# Patient Record
Sex: Female | Born: 1985 | Race: White | Hispanic: No | Marital: Single | Smoking: Former smoker
Health system: Southern US, Community
[De-identification: ages and names within clinical notes are randomized; demographics above are authoritative.]

## PROBLEM LIST (undated history)

## (undated) DIAGNOSIS — Z789 Other specified health status: Secondary | ICD-10-CM

## (undated) HISTORY — PX: WISDOM TOOTH EXTRACTION: SHX21

## (undated) HISTORY — PX: ANKLE FRACTURE SURGERY: SHX122

---

## 2011-07-21 ENCOUNTER — Emergency Department (HOSPITAL_BASED_OUTPATIENT_CLINIC_OR_DEPARTMENT_OTHER): Payer: 59

## 2011-07-21 ENCOUNTER — Emergency Department (HOSPITAL_BASED_OUTPATIENT_CLINIC_OR_DEPARTMENT_OTHER)
Admission: EM | Admit: 2011-07-21 | Discharge: 2011-07-21 | Disposition: A | Discharge status: Home / Self Care | Payer: 59 | Attending: Emergency Medicine | Admitting: Emergency Medicine

## 2011-07-21 ENCOUNTER — Emergency Department (INDEPENDENT_AMBULATORY_CARE_PROVIDER_SITE_OTHER): Payer: 59

## 2011-07-21 ENCOUNTER — Encounter (HOSPITAL_BASED_OUTPATIENT_CLINIC_OR_DEPARTMENT_OTHER): Payer: Self-pay | Admitting: *Deleted

## 2011-07-21 DIAGNOSIS — W108XXA Fall (on) (from) other stairs and steps, initial encounter: Secondary | ICD-10-CM | POA: Insufficient documentation

## 2011-07-21 DIAGNOSIS — W19XXXA Unspecified fall, initial encounter: Secondary | ICD-10-CM

## 2011-07-21 DIAGNOSIS — S93409A Sprain of unspecified ligament of unspecified ankle, initial encounter: Secondary | ICD-10-CM | POA: Insufficient documentation

## 2011-07-21 DIAGNOSIS — M7989 Other specified soft tissue disorders: Secondary | ICD-10-CM

## 2011-07-21 LAB — PREGNANCY, URINE: Preg Test, Ur: NEGATIVE

## 2011-07-21 MED ORDER — OXYCODONE-ACETAMINOPHEN 5-325 MG PO TABS: 1.0000 | ORAL_TABLET | Freq: Four times a day (QID) | ORAL | Status: AC | PRN

## 2011-07-21 MED ORDER — IBUPROFEN 600 MG PO TABS: 600.0000 mg | ORAL_TABLET | Freq: Four times a day (QID) | ORAL | Status: DC | PRN

## 2011-07-21 MED ORDER — OXYCODONE-ACETAMINOPHEN 5-325 MG PO TABS
1.0000 | ORAL_TABLET | Freq: Once | ORAL | Status: AC
  Administered 2011-07-21: 1 via ORAL
  Filled 2011-07-21: qty 1

## 2011-07-21 NOTE — Discharge Instructions (Signed)
Return to the ED with any concerns including worsening pain, numbness discoloration or swelling of her toes, or any other alarming symptoms.

## 2011-07-21 NOTE — ED Notes (Signed)
Pt reports she fell down 5 stairs in the dark- swelling noted to right ankle- 2+pedal pulse present

## 2011-07-21 NOTE — ED Notes (Signed)
Patient transported to X-ray 

## 2011-07-21 NOTE — ED Provider Notes (Signed)
History     CSN: [id number]  Arrival date & time 07/21/11  0204   First MD Initiated Contact with Patient 07/21/11 0225      Chief Complaint  Patient presents with  . Ankle Injury    (Consider location/radiation/quality/duration/timing/severity/associated sxs/prior treatment) HPI Patient presents with complaint of right ankle pain. She states that she fell down several stairs twisting her ankle tonight. She has not been able to bear weight due to pain. Pain is worse on the outer aspect of her ankle. She does have a prior surgical fixation of the ankle which she states was done several years ago. She denies any knee pain she has no neck or back pain and denies striking her head. Pain is continuous. There no systemic symptoms. Movement and palpation make pain worse. There no other alleviating or modifying factors the patient has not taken any medication for the pain prior to arrival. History reviewed. No pertinent past medical history.  Past Surgical History  Procedure Date  . Ankle fracture surgery   . Cesarean section     No family history on file.  History  Substance Use Topics  . Smoking status: Former Games developer  . Smokeless tobacco: Never Used  . Alcohol Use: Not on file    OB History    Grav Para Term Preterm Abortions TAB SAB Ect Mult Living                  Review of Systems ROS reviewed and otherwise negative except for mentioned in HPI  Allergies  Review of patient's allergies indicates no known allergies.  Home Medications   Current Outpatient Rx  Name Route Sig Dispense Refill  . IBUPROFEN 600 MG PO TABS Oral Take 1 tablet (600 mg total) by mouth every 6 (six) hours as needed for pain. 30 tablet 0  . OXYCODONE-ACETAMINOPHEN 5-325 MG PO TABS Oral Take 1-2 tablets by mouth every 6 (six) hours as needed for pain. 15 tablet 0    BP 135/96  Pulse 103  Temp(Src) 98.1 F (36.7 C) (Oral)  Resp 24  SpO2 98%  LMP 06/01/2011  Physical Exam Physical  Examination: General appearance - alert, well appearing, and in no distress Mental status - alert, oriented to person, place, and time Chest - clear to auscultation, no wheezes, rales or rhonchi, symmetric air entry Heart - normal rate, regular rhythm, normal S1, S2, no murmurs, rubs, clicks or gallops Neurological - alert, oriented, normal speech, foot distally NVI Musculoskeletal - right ankle with ttp over lateral malleolus with some overlying swelling, no ttp over fibular head, no medial malleolus tenderness or ttp over metatarsals Back- no midline tenderness to palpation Extremities - peripheral pulses normal, no pedal edema, no clubbing or cyanosis Skin - normal coloration and turgor, no rashes, brisk cap refill distally of foot  ED Course  Procedures (including critical care time)   Labs Reviewed  PREGNANCY, URINE   No results found.   1. Ankle sprain       MDM  Patient presents with complaint of pain in her right ankle after falling tonight. She has not been able to bear weight on her ankle do to significant pain. Her foot is distally neurovascularly intact. X-ray revealed signs of prior distal fibula fixation but no acute bony abnormality tonight. And ASO was applied. Patient was given pain control in the ED and discharged with pain medications. She was given strict return precautions and advised to followup with orthopedics if symptoms continue. Patient was  agreeable with the plan for discharge.        Ethelda Chick, MD 07/24/11 [phone]

## 2011-08-18 ENCOUNTER — Encounter (HOSPITAL_BASED_OUTPATIENT_CLINIC_OR_DEPARTMENT_OTHER): Payer: Self-pay | Admitting: *Deleted

## 2011-08-18 ENCOUNTER — Emergency Department (INDEPENDENT_AMBULATORY_CARE_PROVIDER_SITE_OTHER): Payer: 59

## 2011-08-18 ENCOUNTER — Emergency Department (HOSPITAL_BASED_OUTPATIENT_CLINIC_OR_DEPARTMENT_OTHER)
Admission: EM | Admit: 2011-08-18 | Discharge: 2011-08-18 | Disposition: A | Discharge status: Home / Self Care | Payer: 59 | Attending: Emergency Medicine | Admitting: Emergency Medicine

## 2011-08-18 DIAGNOSIS — J029 Acute pharyngitis, unspecified: Secondary | ICD-10-CM

## 2011-08-18 DIAGNOSIS — Z331 Pregnant state, incidental: Secondary | ICD-10-CM

## 2011-08-18 DIAGNOSIS — R112 Nausea with vomiting, unspecified: Secondary | ICD-10-CM | POA: Insufficient documentation

## 2011-08-18 DIAGNOSIS — R1110 Vomiting, unspecified: Secondary | ICD-10-CM

## 2011-08-18 DIAGNOSIS — R1032 Left lower quadrant pain: Secondary | ICD-10-CM

## 2011-08-18 DIAGNOSIS — Z87891 Personal history of nicotine dependence: Secondary | ICD-10-CM | POA: Insufficient documentation

## 2011-08-18 LAB — URINALYSIS, ROUTINE W REFLEX MICROSCOPIC
Bilirubin Urine: NEGATIVE
Glucose, UA: NEGATIVE mg/dL
Ketones, ur: NEGATIVE mg/dL
Leukocytes, UA: NEGATIVE
Nitrite: NEGATIVE
Protein, ur: NEGATIVE mg/dL

## 2011-08-18 MED ORDER — FOLIC ACID 800 MCG PO TABS: 400.0000 ug | ORAL_TABLET | Freq: Every day | ORAL | Status: AC

## 2011-08-18 NOTE — ED Notes (Signed)
Pt was brought to room 6, discussed with patient her symptoms and informed her of positive urine pregnancy test result.

## 2011-08-18 NOTE — ED Provider Notes (Signed)
History     CSN: [id number]  Arrival date & time 08/18/11  1217   First MD Initiated Contact with Patient 08/18/11 1259      Chief Complaint  Patient presents with  . Abdominal Pain  . Emesis  . Sore Throat    (Consider location/radiation/quality/duration/timing/severity/associated sxs/prior treatment) HPI  Pt presents to the ED with her boyfriend aftter having abdominal pain with nausea and vomiting for 3 days. She admits to having had a sore throat a couple of days ago which has resolved. She states that she has not been keeping anything down and has not tried anything for the symptoms. The pain she has been having are sharp LLQ pains that last only a couple of seconds and then resolve.She admits to having some chills. She denies weakness, fatigue, dizziness, diarrhea, fevers, SOB, CP.  History reviewed. No pertinent past medical history.  Past Surgical History  Procedure Date  . Ankle fracture surgery   . Cesarean section     History reviewed. No pertinent family history.  History  Substance Use Topics  . Smoking status: Former Games developer  . Smokeless tobacco: Never Used  . Alcohol Use: 2.4 oz/week    4 Cans of beer per week    OB History    Grav Para Term Preterm Abortions TAB SAB Ect Mult Living            1      Review of Systems  All other systems reviewed and are negative.    Allergies  Review of patient's allergies indicates no known allergies.  Home Medications   Current Outpatient Rx  Name Route Sig Dispense Refill  . ACETAMINOPHEN 500 MG PO TABS Oral Take 1,000 mg by mouth every 6 (six) hours as needed. Patient used this medication for her stomach pain.    . IBUPROFEN 200 MG PO TABS Oral Take 400 mg by mouth every 6 (six) hours as needed. Patient used this medication for her headache.    . OXYCODONE-ACETAMINOPHEN 5-325 MG PO TABS Oral Take 1 tablet by mouth every 4 (four) hours as needed.    Marland Kitchen FOLIC ACID 800 MCG PO TABS Oral Take 0.5 tablets (400 mcg  total) by mouth daily. 30 tablet 2    BP 123/65  Pulse 94  Temp(Src) 97.5 F (36.4 C) (Oral)  Resp 16  Ht 5\' 7"  (1.702 m)  Wt 210 lb (95.255 kg)  BMI 32.89 kg/m2  SpO2 98%  LMP 06/01/2011  Physical Exam  Nursing note and vitals reviewed. Constitutional: She appears well-developed and well-nourished. No distress.  HENT:  Head: Normocephalic and atraumatic.  Eyes: Pupils are equal, round, and reactive to light.  Neck: Normal range of motion. Neck supple.  Cardiovascular: Normal rate and regular rhythm.   Pulmonary/Chest: Effort normal.  Abdominal: Soft. Bowel sounds are normal. She exhibits no distension and no mass. There is no tenderness. There is no rebound and no guarding.       Benign abdominal exam. Uterus is non enlarged.  Neurological: She is alert.  Skin: Skin is warm and dry.    ED Course  Procedures (including critical care time)  Labs Reviewed  PREGNANCY, URINE - Abnormal; Notable for the following:    Preg Test, Ur POSITIVE (*)    All other components within normal limits  URINALYSIS, ROUTINE W REFLEX MICROSCOPIC   US Ob Comp Less 14 Wks  08/18/2011  *RADIOLOGY REPORT*  Clinical Data: Pregnant, left lower quadrant pain, uncertain LMP; no quantitative beta  HCG for correlation  OBSTETRIC <14 WK [place] AND TRANSVAGINAL OB US  Technique:  Both transabdominal and transvaginal ultrasound examinations were performed for complete evaluation of the gestation as well as the maternal uterus, adnexal regions, and pelvic cul-de-sac.  Transvaginal technique was performed to assess early pregnancy.  Comparison:  None  Intrauterine gestational sac:  Visualized/normal in shape. Yolk sac: Present Embryo: Not identified Cardiac Activity: N/A Heart Rate: N/A bpm  MSD: 10.5 mm,   5  w   6  d         [place] EDC: 04/13/2012  Maternal uterus/adnexae: No subchorionic hemorrhage. Right ovary normal size and morphology, 2.2 x 2.2 x 4.0 cm. Left ovary normal size and morphology, 2.7 x 4.3 x 2.4 cm. No  free pelvic fluid or adnexal masses.  IMPRESSION: Early intrauterine gestation measured at 5 weeks 6 days EGA by mean sac diameter. A yolk sac is identified to confirm intrauterine pregnancy though no fetal pole is yet identified to assess viability. Consider follow-up ultrasound in 10-14 days to assess fetal viability, if clinically indicated.  Original Report Authenticated By: Lollie Marrow, M.D.   US Ob Transvaginal  08/18/2011  *RADIOLOGY REPORT*  Clinical Data: Pregnant, left lower quadrant pain, uncertain LMP; no quantitative beta HCG for correlation  OBSTETRIC <14 WK [place] AND TRANSVAGINAL OB US  Technique:  Both transabdominal and transvaginal ultrasound examinations were performed for complete evaluation of the gestation as well as the maternal uterus, adnexal regions, and pelvic cul-de-sac.  Transvaginal technique was performed to assess early pregnancy.  Comparison:  None  Intrauterine gestational sac:  Visualized/normal in shape. Yolk sac: Present Embryo: Not identified Cardiac Activity: N/A Heart Rate: N/A bpm  MSD: 10.5 mm,   5  w   6  d         [place] EDC: 04/13/2012  Maternal uterus/adnexae: No subchorionic hemorrhage. Right ovary normal size and morphology, 2.2 x 2.2 x 4.0 cm. Left ovary normal size and morphology, 2.7 x 4.3 x 2.4 cm. No free pelvic fluid or adnexal masses.  IMPRESSION: Early intrauterine gestation measured at 5 weeks 6 days EGA by mean sac diameter. A yolk sac is identified to confirm intrauterine pregnancy though no fetal pole is yet identified to assess viability. Consider follow-up ultrasound in 10-14 days to assess fetal viability, if clinically indicated.  Original Report Authenticated By: Lollie Marrow, M.D.     1. Pregnancy as incidental finding       MDM    pts ultrasound shows single  intrauterine pregnancy. Urinalysis is negative for infection. Pt started on prenatal vitamins and referred [facility] for follow-up appointment and OB care.  Pt has been  advised of the symptoms that warrant their return to the ED. Patient has voiced understanding and has agreed to follow-up with the PCP or specialist.   Dr. Radford Pax was involved in the care of this patient.  Dorthula Matas, PA 08/30/11 1446

## 2011-08-18 NOTE — ED Notes (Signed)
Intermittent sharp abd pain x 3 days.  Throwing up since Friday.  Sore throat started yesterday. Throwing up everything the pt is eating and drinking.  Has not taken any medications for relief. Denies fever. Has had some chills.

## 2011-08-18 NOTE — Discharge Instructions (Signed)
ABCs of Pregnancy A Antepartum care is very important. Be sure you see your doctor and get prenatal care as soon as you think you are pregnant. At this time, you will be tested for infection, genetic abnormalities and potential problems with you and the pregnancy. This is the time to discuss diet, exercise, work, medications, labor, pain medication during labor and the possibility of a cesarean delivery. Ask any questions that may concern you. It is important to see your doctor regularly throughout your pregnancy. Avoid exposure to toxic substances and chemicals - such as cleaning solvents, lead and mercury, some insecticides, and paint. Pregnant women should avoid exposure to paint fumes, and fumes that cause you to feel ill, dizzy or faint. When possible, it is a good idea to have a pre-pregnancy consultation with your caregiver to begin some important recommendations your caregiver suggests such as, taking folic acid, exercising, quitting smoking, avoiding alcoholic beverages, etc. B Breastfeeding is the healthiest choice for both you and your baby. It has many nutritional benefits for the baby and health benefits for the mother. It also creates a very tight and loving bond between the baby and mother. Talk to your doctor, your family and friends, and your employer about how you choose to feed your baby and how they can support you in your decision. Not all birth defects can be prevented, but a woman can take actions that may increase her chance of having a healthy baby. Many birth defects happen very early in pregnancy, sometimes before a woman even knows she is pregnant. Birth defects or abnormalities of any child in your or the father's family should be discussed with your caregiver. Get a good support bra as your breast size changes. Wear it especially when you exercise and when nursing.  C Celebrate the news of your pregnancy with the your spouse/father and family. Childbirth classes are helpful to  take for you and the spouse/father because it helps to understand what happens during the pregnancy, labor and delivery. Cesarean delivery should be discussed with your doctor so you are prepared for that possibility. The pros and cons of circumcision if it is a boy, should be discussed with your pediatrician. Cigarette smoking during pregnancy can result in low birth weight babies. It has been associated with infertility, miscarriages, tubal pregnancies, infant death (mortality) and poor health (morbidity) in childhood. Additionally, cigarette smoking may cause long-term learning disabilities. If you smoke, you should try to quit before getting pregnant and not smoke during the pregnancy. Secondary smoke may also harm a mother and her developing baby. It is a good idea to ask people to stop smoking around you during your pregnancy and after the baby is born. Extra calcium is necessary when you are pregnant and is found in your prenatal vitamin, in dairy products, green leafy vegetables and in calcium supplements. D A healthy diet according to your current weight and height, along with vitamins and mineral supplements should be discussed with your caregiver. Domestic abuse or violence should be made known to your doctor right away to get the situation corrected. Drink more water when you exercise to keep hydrated. Discomfort of your back and legs usually develops and progresses from the middle of the second trimester through to delivery of the baby. This is because of the enlarging baby and uterus, which may also affect your balance. Do not take illegal drugs. Illegal drugs can seriously harm the baby and you. Drink extra fluids (water is best) throughout pregnancy to help  your body keep up with the increases in your blood volume. Drink at least 6 to 8 glasses of water, fruit juice, or milk each day. A good way to know you are drinking enough fluid is when your urine looks almost like clear water or is very light  yellow.  E Eat healthy to get the nutrients you and your unborn baby need. Your meals should include the five basic food groups. Exercise (30 minutes of light to moderate exercise a day) is important and encouraged during pregnancy, if there are no medical problems or problems with the pregnancy. Exercise that causes discomfort or dizziness should be stopped and reported to your caregiver. Emotions during pregnancy can change from being ecstatic to depression and should be understood by you, your partner and your family. F Fetal screening with ultrasound, amniocentesis and monitoring during pregnancy and labor is common and sometimes necessary. Take 400 micrograms of folic acid daily both before, when possible, and during the first few months of pregnancy to reduce the risk of birth defects of the brain and spine. All women who could possibly become pregnant should take a vitamin with folic acid, every day. It is also important to eat a healthy diet with fortified foods (enriched grain products, including cereals, rice, breads, and pastas) and foods with natural sources of folate (orange juice, green leafy vegetables, beans, peanuts, broccoli, asparagus, peas, and lentils). The father should be involved with all aspects of the pregnancy including, the prenatal care, childbirth classes, labor, delivery, and postpartum time. Fathers may also have emotional concerns about being a father, financial needs, and raising a family. G Genetic testing should be done appropriately. It is important to know your family and the father's history. If there have been problems with pregnancies or birth defects in your family, report these to your doctor. Also, genetic counselors can talk with you about the information you might need in making decisions about having a family. You can call a major medical center in your area for help in finding a board-certified genetic counselor. Genetic testing and counseling should be done  before pregnancy when possible, especially if there is a history of problems in the mother's or father's family. Certain ethnic backgrounds are more at risk for genetic defects. H Get familiar with the hospital where you will be having your baby. Get to know how long it takes to get there, the labor and delivery area, and the hospital procedures. Be sure your medical insurance is accepted there. Get your home ready for the baby including, clothes, the baby's room (when possible), furniture and car seat. Hand washing is important throughout the day, especially after handling raw meat and poultry, changing the baby's diaper or using the bathroom. This can help prevent the spread of many bacteria and viruses that cause infection. Your hair may become dry and thinner, but will return to normal a few weeks after the baby is born. Heartburn is a common problem that can be treated by taking antacids recommended by your caregiver, eating smaller meals 5 or 6 times a day, not drinking liquids when eating, drinking between meals and raising the head of your bed 2 to 3 inches. I Insurance to cover you, the baby, doctor and hospital should be reviewed so that you will be prepared to pay any costs not covered by your insurance plan. If you do not have medical insurance, there are usually clinics and services available for you in your community. Take 30 milligrams of iron during  your pregnancy as prescribed by your doctor to reduce the risk of low red blood cells (anemia) later in pregnancy. All women of childbearing age should eat a diet rich in iron. J There should be a joint effort for the mother, father and any other children to adapt to the pregnancy financially, emotionally, and psychologically during the pregnancy. Join a support group for moms-to-be. Or, join a class on parenting or childbirth. Have the family participate when possible. K Know your limits. Let your caregiver know if you experience any of the  following:   Pain of any kind.   Strong cramps.   You develop a lot of weight in a short period of time (5 pounds in 3 to 5 days).   Vaginal bleeding, leaking of amniotic fluid.   Headache, vision problems.   Dizziness, fainting, shortness of breath.   Chest pain.   Fever of 102 F (38.9 C) or higher.   Gush of clear fluid from your vagina.   Painful urination.   Domestic violence.   Irregular heartbeat (palpitations).   Rapid beating of the heart (tachycardia).   Constant feeling sick to your stomach (nauseous) and vomiting.   Trouble walking, fluid retention (edema).   Muscle weakness.   If your baby has decreased activity.   Persistent diarrhea.   Abnormal vaginal discharge.   Uterine contractions at 20-minute intervals.   Back pain that travels down your leg.  L Learn and practice that what you eat and drink should be in moderation and healthy for you and your baby. Legal drugs such as alcohol and caffeine are important issues for pregnant women. There is no safe amount of alcohol a woman can drink while pregnant. Fetal alcohol syndrome, a disorder characterized by growth retardation, facial abnormalities, and central nervous system dysfunction, is caused by a woman's use of alcohol during pregnancy. Caffeine, found in tea, coffee, soft drinks and chocolate, should also be limited. Be sure to read labels when trying to cut down on caffeine during pregnancy. More than 200 foods, beverages, and over-the-counter medications contain caffeine and have a high salt content! There are coffees and teas that do not contain caffeine. M Medical conditions such as diabetes, epilepsy, and high blood pressure should be treated and kept under control before pregnancy when possible, but especially during pregnancy. Ask your caregiver about any medications that may need to be changed or adjusted during pregnancy. If you are currently taking any medications, ask your caregiver if it  is safe to take them while you are pregnant or before getting pregnant when possible. Also, be sure to discuss any herbs or vitamins you are taking. They are medicines, too! Discuss with your doctor all medications, prescribed and over-the-counter, that you are taking. During your prenatal visit, discuss the medications your doctor may give you during labor and delivery. N Never be afraid to ask your doctor or caregiver questions about your health, the progress of the pregnancy, family problems, stressful situations, and recommendation for a pediatrician, if you do not have one. It is better to take all precautions and discuss any questions or concerns you may have during your office visits. It is a good idea to write down your questions before you visit the doctor. O Over-the-counter cough and cold remedies may contain alcohol or other ingredients that should be avoided during pregnancy. Ask your caregiver about prescription, herbs or over-the-counter medications that you are taking or may consider taking while pregnant.  P Physical activity during pregnancy can  benefit both you and your baby by lessening discomfort and fatigue, providing a sense of well-being, and increasing the likelihood of early recovery after delivery. Light to moderate exercise during pregnancy strengthens the belly (abdominal) and back muscles. This helps improve posture. Practicing yoga, walking, swimming, and cycling on a stationary bicycle are usually safe exercises for pregnant women. Avoid scuba diving, exercise at high altitudes (over 3000 feet), skiing, horseback riding, contact sports, etc. Always check with your doctor before beginning any kind of exercise, especially during pregnancy and especially if you did not exercise before getting pregnant. Q Queasiness, stomach upset and morning sickness are common during pregnancy. Eating a couple of crackers or dry toast before getting out of bed. Foods that you normally love may  make you feel sick to your stomach. You may need to substitute other nutritious foods. Eating 5 or 6 small meals a day instead of 3 large ones may make you feel better. Do not drink with your meals, drink between meals. Questions that you have should be written down and asked during your prenatal visits. R Read about and make plans to baby-proof your home. There are important tips for making your home a safer environment for your baby. Review the tips and make your home safer for you and your baby. Read food labels regarding calories, salt and fat content in the food. S Saunas, hot tubs, and steam rooms should be avoided while you are pregnant. Excessive high heat may be harmful during your pregnancy. Your caregiver will screen and examine you for sexually transmitted diseases and genetic disorders during your prenatal visits. Learn the signs of labor. Sexual relations while pregnant is safe unless there is a medical or pregnancy problem and your caregiver advises against it. T Traveling long distances should be avoided especially in the third trimester of your pregnancy. If you do have to travel out of state, be sure to take a copy of your medical records and medical insurance plan with you. You should not travel long distances without seeing your doctor first. Most airlines will not allow you to travel after 36 weeks of pregnancy. Toxoplasmosis is an infection caused by a parasite that can seriously harm an unborn baby. Avoid eating undercooked meat and handling cat litter. Be sure to wear gloves when gardening. Tingling of the hands and fingers is not unusual and is due to fluid retention. This will go away after the baby is born. U Womb (uterus) size increases during the first trimester. Your kidneys will begin to function more efficiently. This may cause you to feel the need to urinate more often. You may also leak urine when sneezing, coughing or laughing. This is due to the growing uterus pressing  against your bladder, which lies directly in front of and slightly under the uterus during the first few months of pregnancy. If you experience burning along with frequency of urination or bloody urine, be sure to tell your doctor. The size of your uterus in the third trimester may cause a problem with your balance. It is advisable to maintain good posture and avoid wearing high heels during this time. An ultrasound of your baby may be necessary during your pregnancy and is safe for you and your baby. V Vaccinations are an important concern for pregnant women. Get needed vaccines before pregnancy. Center for Disease Control (http://www.wolf.info/) has clear guidelines for the use of vaccines during pregnancy. Review the list, be sure to discuss it with your doctor. Prenatal vitamins are helpful  and healthy for you and the baby. Do not take extra vitamins except what is recommended. Taking too much of certain vitamins can cause overdose problems. Continuous vomiting should be reported to your caregiver. Varicose veins may appear especially if there is a family history of varicose veins. They should subside after the delivery of the baby. Support hose helps if there is leg discomfort. W Being overweight or underweight during pregnancy may cause problems. Try to get within 15 pounds of your ideal weight before pregnancy. Remember, pregnancy is not a time to be dieting! Do not stop eating or start skipping meals as your weight increases. Both you and your baby need the calories and nutrition you receive from a healthy diet. Be sure to consult with your doctor about your diet. There is a formula and diet plan available depending on whether you are overweight or underweight. Your caregiver or nutritionist can help and advise you if necessary. X Avoid X-rays. If you must have dental work or diagnostic tests, tell your dentist or physician that you are pregnant so that extra care can be taken. X-rays should only be taken when  the risks of not taking them outweigh the risk of taking them. If needed, only the minimum amount of radiation should be used. When X-rays are necessary, protective lead shields should be used to cover areas of the body that are not being X-rayed. Y Your baby loves you. Breastfeeding your baby creates a loving and very close bond between the two of you. Give your baby a healthy environment to live in while you are pregnant. Infants and children require constant care and guidance. Their health and safety should be carefully watched at all times. After the baby is born, rest or take a nap when the baby is sleeping. Z Get your ZZZs. Be sure to get plenty of rest. Resting on your side as often as possible, especially on your left side is advised. It provides the best circulation to your baby and helps reduce swelling. Try taking a nap for 30 to 45 minutes in the afternoon when possible. After the baby is born rest or take a nap when the baby is sleeping. Try elevating your feet for that amount of time when possible. It helps the circulation in your legs and helps reduce swelling.  Most information courtesy of the CDC. Document Released: 05/05/2005 Document Revised: 04/24/2011 Document Reviewed: 01/17/2009 [facility] Patient Information 2012 [place], [place].Folic Acid in Pregnancy Folic acid is a B vitamin that helps prevent neural tube defects (NTDs). The neural tube is the part of a developing baby that becomes the brain and spinal cord. When the neural tube does not close properly, a baby is born with an NTD. NTDs include spina bifida, hernia of the spinal cord, and the absence of part of, or all of the brain (anencephaly).  Take folic acid at least 4 weeks before getting pregnant and through the first 3 months of pregnancy. This is when the neural tube is developing. It is available in most multivitamins, as a folic acid-only supplement, and in some foods. Taking the right amount of folic acid before  conception and during pregnancy lessens the chances of having a baby born with an NTD. Giving folic acid will not affect a neural tube defect if it is already present. DIAGNOSIS   An Alpha-Fetoprotein (AFP) blood or amniotic fluid test will show high levels of the alpha-feto protein if a woman is carrying a baby with an NTD. This test is  done on all pregnant women in the first trimester.   An ultrasound may detect an NTD.  WHAT YOU CAN DO:  Take a multivitamin with at least 0.4 milligrams (400 micrograms) of folic acid daily at least 4 weeks before getting pregnant and through the first 12 weeks of pregnancy.   If you have already had a pregnancy affected by an NTD, take 4 milligrams (4,000 micrograms) of folic acid daily. Take this amount 1 month before you start trying to get pregnant and continue through the first 3 months of pregnancy.   Talk to your caregiver if you are taking medicines for seizures. Your caregiver will be able to manage your seizure medicines and your pregnancy in the best way.  FOLIC ACID IN FOODS Eat a healthy diet that has foods that contain folic acid, the natural form of the vitamin. Such foods include:  Fortified breakfast cereals.   Lentils.   Asparagus.   Spinach.   Organ meats (liver).   Black beans.   Peanuts (eat only if you do not have a peanut allergy).   Broccoli.   Strawberries, oranges.   Orange juice (from concentrate is best).   Enriched breads and pasta.   Romaine lettuce.  TALK TO YOUR CAREGIVER IF:  You are in your first trimester and have high blood sugar.   You are in your first trimester and have an oral temperature above 102 F (38.9 C).   You are in your first trimester and had sauna treatments.   You are pregnant (or want to become pregnant) and take a prescription medicine called valproic acid.  In almost all cases, a fetus found to have an NTD will need specialized care that may not be available in all hospitals.  Talk to your caregiver about what is best for you and your baby. Document Released: 05/08/2003 Document Revised: 04/24/2011 Document Reviewed: 08/08/2009 [facility] Patient Information 2012 [place], [place].Pregnancy If you are planning on getting pregnant, it is a good idea to make a preconception appointment with your care- giver to discuss having a healthy lifestyle before getting pregnant. Such as, diet, weight, exercise, taking prenatal vitamins especially folic acid (it helps prevent brain and spinal cord defects), avoiding alcohol, smoking and illegal drugs, medical problems (diabetes, convulsions), family history of genetic problems, working conditions and immunizations. It is better to have knowledge of these things and do something about them before getting pregnant. In your pregnancy, it is important to follow certain guidelines to have a healthy baby. It is very important to get good prenatal care and follow your caregiver's instructions. Prenatal care includes all the medical care you receive before your baby's birth. This helps to prevent problems during the pregnancy and childbirth. HOME CARE INSTRUCTIONS   Start your prenatal visits by the 12th week of pregnancy or before when possible. They are usually scheduled monthly at first. They are more often in the last 2 months before delivery. It is important that you keep your caregiver's appointments and follow your caregiver's instructions regarding medication use, exercise, and diet.   During pregnancy, you are providing food for you and your baby. Eat a regular, well-balanced diet. Choose foods such as meat, fish, milk and other dairy products, vegetables, fruits, whole-grain breads and cereals. Your caregiver will inform you of the ideal weight gain depending on your current height and weight. Drink lots of liquids. Try to drink 8 glasses of water a day.   Alcohol is associated with a number of birth defects including  fetal alcohol syndrome.  It is best to avoid alcohol completely. Smoking will cause low birth rate and prematurity. Use of alcohol and nicotine during your pregnancy also increases the chances that your child will be chemically dependent later in their life and may contribute to SIDS (Sudden Infant Death Syndrome).   Do not use illegal drugs.   Only take prescription or over-the-counter medications that are recommended by your caregiver. Other medications can cause genetic and physical problems in the baby.   Morning sickness can often be helped by keeping soda crackers at the bedside. Eat a couple before arising in the morning.   A sexual relationship may be continued until near the end of pregnancy if there are no other problems such as early (premature) leaking of amniotic fluid from the membranes, vaginal bleeding, painful intercourse or belly (abdominal) pain.   Exercise regularly. Check with your caregiver if you are unsure of the safety of some of your exercises.   Do not use hot tubs, steam rooms or saunas. These increase the risk of fainting or passing out and hurting yourself and the baby. Swimming is OK for exercise. Get plenty of rest, including afternoon naps when possible especially in the third trimester.   Avoid toxic odors and chemicals.   Do not wear high heels. They may cause you to lose your balance and fall.   Do not lift over 5 pounds. If you do lift anything, lift with your legs and thighs, not your back.   Avoid long trips, especially in the third trimester.   If you have to travel out of the city or state, take a copy of your medical records with you.  SEEK IMMEDIATE MEDICAL CARE IF:   You develop an unexplained oral temperature above 102 F (38.9 C), or as your caregiver suggests.   You have leaking of fluid from the vagina. If leaking membranes are suspected, take your temperature and inform your caregiver of this when you call.   There is vaginal spotting or bleeding. Notify your  caregiver of the amount and how many pads are used.   You continue to feel sick to your stomach (nauseous) and have no relief from remedies suggested, or you throw up (vomit) blood or coffee ground like materials.   You develop upper abdominal pain.   You have round ligament discomfort in the lower abdominal area. This still must be evaluated by your caregiver.   You feel contractions of the uterus.   You do not feel the baby move, or there is less movement than before.   You have painful urination.   You have abnormal vaginal discharge.   You have persistent diarrhea.   You get a severe headache.   You have problems with your vision.   You develop muscle weakness.   You feel dizzy and faint.   You develop shortness of breath.   You develop chest pain.   You have back pain that travels down to your leg and feet.   You feel irregular or a very fast heartbeat.   You develop excessive weight gain in a short period of time (5 pounds in 3 to 5 days).   You are involved with a domestic violence situation.  Document Released: 05/05/2005 Document Revised: 04/24/2011 Document Reviewed: 10/27/2008 [facility] Patient Information 2012 [place], [place].Mercury in Fish and Shellfish Nearly all fish and shellfish contain mercury. Some fish and shellfish contain higher levels of mercury that may harm an unborn baby or a young child's  developing nervous system. The risks from mercury depend on the amount of fish and shellfish eaten, and the levels of mercury in the fish and shellfish. Women who want to become pregnant, pregnant women, nursing mothers, and young children should avoid certain fish and eat fish and shellfish that are lower in mercury. RECOMMENDATIONS: Avoid:  Shark.   Swordfish.   King mackerel.   Tilefish.   Marlin.  Eat no more than 6 ounces (one average meal) a week of:  Albocore (white) tuna per week.  Eat no more than 12 ounces a week of:  Shrimp.   Canned  light tuna.   Salmon.   Pollock.   Catfish.  Check local advisories about the safety of fish in your local lakes, rivers, and coastal areas. If no advice is available, eat up to 6 ounces (one average meal) per week of fish you catch from local waters. Do not eat any other fish during that week.  FREQUENTLY ASKED QUESTIONS: What is mercury and methylmercury? Mercury happens naturally in the environment and can also be released into the air through industrial pollution. Mercury falls from the air and can collect in streams and oceans and is turned into methylmercury in the water. It is this type of mercury that can be harmful to an unborn baby and a young child. Fish absorb the methylmercury as they feed in these waters and it builds up in them. It builds up more in some types of fish and shellfish than others, which is why the levels vary. Why should I be concerned about methylmercury if I want to become pregnant someday? If you regularly eat types of fish that are high in methylmercury, it can collect in your blood stream over time. Methylmercury is removed from the body naturally, but it may take over a year for the levels to drop a lot. Thus, it may be present in a woman even before she becomes pregnant. This is the reason why women who are trying to become pregnant should also avoid eating certain types of fish. Is there methylmercury in all fish and shellfish? Nearly all fish and shellfish contain traces of methylmercury. However, larger fish that live longer have the highest levels of methylmercury because they have had more time to collect it. Large fish pose the greatest risk.  What about fish sticks and fast food sandwiches? Fish sticks and fastfood sandwiches are commonly made from fish that are low in mercury. What if I eat more than the recommended amount of fish and shellfish in a week? One week's consumption of fish does not change the level of methylmercury in the body much at all. If  you eat a lot of fish one week, you can cut back for the next week or two. Where do I get information about the safety of fish caught by family or friends? Before you go fishing, check your fishing regulations booklet for information about recreationally caught fish. You can also contact your local health department for information about local advisories.  Document Released: 06/25/2005 Document Revised: 04/24/2011 Document Reviewed: 08/13/2005 [facility] Patient Information 2012 [place], [place].

## 2011-08-30 NOTE — ED Provider Notes (Signed)
Medical screening examination/treatment/procedure(s) were performed by non-physician practitioner and as supervising physician I was immediately available for consultation/collaboration.    Nelia Shi, MD 08/30/11 [phone]

## 2011-11-01 ENCOUNTER — Emergency Department (HOSPITAL_BASED_OUTPATIENT_CLINIC_OR_DEPARTMENT_OTHER)
Admission: EM | Admit: 2011-11-01 | Discharge: 2011-11-01 | Disposition: A | Discharge status: Home / Self Care | Payer: 59 | Attending: Emergency Medicine | Admitting: Emergency Medicine

## 2011-11-01 ENCOUNTER — Encounter (HOSPITAL_BASED_OUTPATIENT_CLINIC_OR_DEPARTMENT_OTHER): Payer: Self-pay | Admitting: *Deleted

## 2011-11-01 DIAGNOSIS — O210 Mild hyperemesis gravidarum: Secondary | ICD-10-CM

## 2011-11-01 DIAGNOSIS — Z3490 Encounter for supervision of normal pregnancy, unspecified, unspecified trimester: Secondary | ICD-10-CM

## 2011-11-01 DIAGNOSIS — O99891 Other specified diseases and conditions complicating pregnancy: Secondary | ICD-10-CM | POA: Insufficient documentation

## 2011-11-01 DIAGNOSIS — Z87891 Personal history of nicotine dependence: Secondary | ICD-10-CM | POA: Insufficient documentation

## 2011-11-01 DIAGNOSIS — R079 Chest pain, unspecified: Secondary | ICD-10-CM | POA: Insufficient documentation

## 2011-11-01 MED ORDER — ONDANSETRON 8 MG PO TBDP: 8.0000 mg | ORAL_TABLET | Freq: Three times a day (TID) | ORAL | Status: AC | PRN

## 2011-11-01 NOTE — ED Provider Notes (Signed)
History     CSN: [id number]  Arrival date & time 11/01/11  1315   First MD Initiated Contact with Patient 11/01/11 1403      HPI Patient reports she is 4 months pregnant. States for the last 3 days has had intermittent sharp substernal chest pain. Reports pain radiates to the right side of her chest. States pain is worse with breasts, palpation, coughing, and sneezing. Denies any adverse symptoms for cough otherwise. Denies history blood clots, hormone therapy, birth control, smoking, recent travel, family history of early heart disease, family history of sudden death. Patient also reports for the last 4 months of her pregnancy she's had severe nausea and vomiting in mild headaches and dizziness. Denies abdominal pain, vaginal bleeding, vaginal discharge, urinary symptoms.  Patient is a 26 y.o. female presenting with chest pain. The history is provided by the patient.  Chest Pain The chest pain began more than 2 weeks ago. Chest pain occurs intermittently. The chest pain is unchanged. The severity of the pain is moderate. The quality of the pain is described as sharp. The pain does not radiate. Chest pain is worsened by deep breathing. Primary symptoms include shortness of breath, nausea, vomiting and dizziness. Pertinent negatives for primary symptoms include no fever, no fatigue, no cough, no wheezing and no abdominal pain.  Dizziness also occurs with nausea and vomiting. Dizziness does not occur with weakness or diaphoresis.  Pertinent negatives for associated symptoms include no diaphoresis, no numbness and no weakness.  Pertinent negatives for past medical history include no DVT and no PE.  Pertinent negatives for family medical history include: no early MI in family, no PE in family and no sudden death in family.     History reviewed. No pertinent past medical history.  Past Surgical History  Procedure Date  . Ankle fracture surgery   . Cesarean section     History reviewed. No  pertinent family history.  History  Substance Use Topics  . Smoking status: Former Games developer  . Smokeless tobacco: Never Used  . Alcohol Use: 2.4 oz/week    4 Cans of beer per week    OB History    Grav Para Term Preterm Abortions TAB SAB Ect Mult Living   2 1        1       Review of Systems  Constitutional: Negative for fever, diaphoresis and fatigue.  HENT: Negative for neck pain.   Respiratory: Positive for shortness of breath. Negative for cough and wheezing.   Cardiovascular: Positive for chest pain. Negative for leg swelling.  Gastrointestinal: Positive for nausea and vomiting. Negative for abdominal pain.  Neurological: Positive for dizziness and headaches. Negative for weakness and numbness.  All other systems reviewed and are negative.    Allergies  Review of patient's allergies indicates no known allergies.  Home Medications   Current Outpatient Rx  Name Route Sig Dispense Refill  . ACETAMINOPHEN 500 MG PO TABS Oral Take 1,000 mg by mouth every 6 (six) hours as needed. Patient used this medication for her stomach pain.    Marland Kitchen FOLIC ACID 800 MCG PO TABS Oral Take 0.5 tablets (400 mcg total) by mouth daily. 30 tablet 2  . IBUPROFEN 200 MG PO TABS Oral Take 400 mg by mouth every 6 (six) hours as needed. Patient used this medication for her headache.    . OXYCODONE-ACETAMINOPHEN 5-325 MG PO TABS Oral Take 1 tablet by mouth every 4 (four) hours as needed.  BP 115/75  Pulse 82  Temp 98.3 F (36.8 C) (Oral)  Resp 16  Ht 5\' 7"  (1.702 m)  Wt 200 lb (90.719 kg)  BMI 31.32 kg/m2  SpO2 100%  LMP 06/01/2011  Physical Exam  Vitals reviewed. Constitutional: She is oriented to person, place, and time. Vital signs are normal. She appears well-developed and well-nourished.  HENT:  Head: Normocephalic and atraumatic.  Eyes: Conjunctivae are normal. Pupils are equal, round, and reactive to light.  Neck: Normal range of motion. Neck supple.  Cardiovascular: Normal rate,  regular rhythm and normal heart sounds.  Exam reveals no friction rub.   No murmur heard. Pulmonary/Chest: Effort normal and breath sounds normal. She has no wheezes. She has no rhonchi. She has no rales. She exhibits tenderness (substernal).  Abdominal: Soft. Bowel sounds are normal. She exhibits no distension and no mass. There is no tenderness. There is no rebound and no guarding.  Musculoskeletal: Normal range of motion.  Neurological: She is alert and oriented to person, place, and time. Coordination normal.  Skin: Skin is warm and dry. No rash noted. No erythema. No pallor.    ED Course  Procedures  MDM    Patient is PERC negative. Also has normal vital signs. Discussed with patient and family that likely symptoms are due to pregnancy. Patient could also potentially have acid reflux due to several months of nausea and vomiting. Will refer patient to OB since she has not found one yet. Patient and family agree with plan and requests nausea medication. Will prescribe Zofran.   Thomasene Lot, PA-C 11/01/11 1520

## 2011-11-01 NOTE — ED Notes (Signed)
Pt states she is 4 months preg with no prenatal care. Describes sharp right side pain x 3 days. Increased with cough, sneeze, deep inspiration. +N/V, dizziness and H/A for 1 month.

## 2011-11-01 NOTE — ED Provider Notes (Signed)
Medical screening examination/treatment/procedure(s) were performed by non-physician practitioner and as supervising physician I was immediately available for consultation/collaboration.   Dione Booze, MD 11/01/11 [phone]

## 2011-11-01 NOTE — Discharge Instructions (Signed)
ABCs of Pregnancy A Antepartum care is very important. Be sure you see your doctor and get prenatal care as soon as you think you are pregnant. At this time, you will be tested for infection, genetic abnormalities and potential problems with you and the pregnancy. This is the time to discuss diet, exercise, work, medications, labor, pain medication during labor and the possibility of a cesarean delivery. Ask any questions that may concern you. It is important to see your doctor regularly throughout your pregnancy. Avoid exposure to toxic substances and chemicals - such as cleaning solvents, lead and mercury, some insecticides, and paint. Pregnant women should avoid exposure to paint fumes, and fumes that cause you to feel ill, dizzy or faint. When possible, it is a good idea to have a pre-pregnancy consultation with your caregiver to begin some important recommendations your caregiver suggests such as, taking folic acid, exercising, quitting smoking, avoiding alcoholic beverages, etc. B Breastfeeding is the healthiest choice for both you and your baby. It has many nutritional benefits for the baby and health benefits for the mother. It also creates a very tight and loving bond between the baby and mother. Talk to your doctor, your family and friends, and your employer about how you choose to feed your baby and how they can support you in your decision. Not all birth defects can be prevented, but a woman can take actions that may increase her chance of having a healthy baby. Many birth defects happen very early in pregnancy, sometimes before a woman even knows she is pregnant. Birth defects or abnormalities of any child in your or the father's family should be discussed with your caregiver. Get a good support bra as your breast size changes. Wear it especially when you exercise and when nursing.  C Celebrate the news of your pregnancy with the your spouse/father and family. Childbirth classes are helpful to  take for you and the spouse/father because it helps to understand what happens during the pregnancy, labor and delivery. Cesarean delivery should be discussed with your doctor so you are prepared for that possibility. The pros and cons of circumcision if it is a boy, should be discussed with your pediatrician. Cigarette smoking during pregnancy can result in low birth weight babies. It has been associated with infertility, miscarriages, tubal pregnancies, infant death (mortality) and poor health (morbidity) in childhood. Additionally, cigarette smoking may cause long-term learning disabilities. If you smoke, you should try to quit before getting pregnant and not smoke during the pregnancy. Secondary smoke may also harm a mother and her developing baby. It is a good idea to ask people to stop smoking around you during your pregnancy and after the baby is born. Extra calcium is necessary when you are pregnant and is found in your prenatal vitamin, in dairy products, green leafy vegetables and in calcium supplements. D A healthy diet according to your current weight and height, along with vitamins and mineral supplements should be discussed with your caregiver. Domestic abuse or violence should be made known to your doctor right away to get the situation corrected. Drink more water when you exercise to keep hydrated. Discomfort of your back and legs usually develops and progresses from the middle of the second trimester through to delivery of the baby. This is because of the enlarging baby and uterus, which may also affect your balance. Do not take illegal drugs. Illegal drugs can seriously harm the baby and you. Drink extra fluids (water is best) throughout pregnancy to help  your body keep up with the increases in your blood volume. Drink at least 6 to 8 glasses of water, fruit juice, or milk each day. A good way to know you are drinking enough fluid is when your urine looks almost like clear water or is very light  yellow.  E Eat healthy to get the nutrients you and your unborn baby need. Your meals should include the five basic food groups. Exercise (30 minutes of light to moderate exercise a day) is important and encouraged during pregnancy, if there are no medical problems or problems with the pregnancy. Exercise that causes discomfort or dizziness should be stopped and reported to your caregiver. Emotions during pregnancy can change from being ecstatic to depression and should be understood by you, your partner and your family. F Fetal screening with ultrasound, amniocentesis and monitoring during pregnancy and labor is common and sometimes necessary. Take 400 micrograms of folic acid daily both before, when possible, and during the first few months of pregnancy to reduce the risk of birth defects of the brain and spine. All women who could possibly become pregnant should take a vitamin with folic acid, every day. It is also important to eat a healthy diet with fortified foods (enriched grain products, including cereals, rice, breads, and pastas) and foods with natural sources of folate (orange juice, green leafy vegetables, beans, peanuts, broccoli, asparagus, peas, and lentils). The father should be involved with all aspects of the pregnancy including, the prenatal care, childbirth classes, labor, delivery, and postpartum time. Fathers may also have emotional concerns about being a father, financial needs, and raising a family. G Genetic testing should be done appropriately. It is important to know your family and the father's history. If there have been problems with pregnancies or birth defects in your family, report these to your doctor. Also, genetic counselors can talk with you about the information you might need in making decisions about having a family. You can call a major medical center in your area for help in finding a board-certified genetic counselor. Genetic testing and counseling should be done  before pregnancy when possible, especially if there is a history of problems in the mother's or father's family. Certain ethnic backgrounds are more at risk for genetic defects. H Get familiar with the hospital where you will be having your baby. Get to know how long it takes to get there, the labor and delivery area, and the hospital procedures. Be sure your medical insurance is accepted there. Get your home ready for the baby including, clothes, the baby's room (when possible), furniture and car seat. Hand washing is important throughout the day, especially after handling raw meat and poultry, changing the baby's diaper or using the bathroom. This can help prevent the spread of many bacteria and viruses that cause infection. Your hair may become dry and thinner, but will return to normal a few weeks after the baby is born. Heartburn is a common problem that can be treated by taking antacids recommended by your caregiver, eating smaller meals 5 or 6 times a day, not drinking liquids when eating, drinking between meals and raising the head of your bed 2 to 3 inches. I Insurance to cover you, the baby, doctor and hospital should be reviewed so that you will be prepared to pay any costs not covered by your insurance plan. If you do not have medical insurance, there are usually clinics and services available for you in your community. Take 30 milligrams of iron during  your pregnancy as prescribed by your doctor to reduce the risk of low red blood cells (anemia) later in pregnancy. All women of childbearing age should eat a diet rich in iron. J There should be a joint effort for the mother, father and any other children to adapt to the pregnancy financially, emotionally, and psychologically during the pregnancy. Join a support group for moms-to-be. Or, join a class on parenting or childbirth. Have the family participate when possible. K Know your limits. Let your caregiver know if you experience any of the  following:   Pain of any kind.   Strong cramps.   You develop a lot of weight in a short period of time (5 pounds in 3 to 5 days).   Vaginal bleeding, leaking of amniotic fluid.   Headache, vision problems.   Dizziness, fainting, shortness of breath.   Chest pain.   Fever of 102 F (38.9 C) or higher.   Gush of clear fluid from your vagina.   Painful urination.   Domestic violence.   Irregular heartbeat (palpitations).   Rapid beating of the heart (tachycardia).   Constant feeling sick to your stomach (nauseous) and vomiting.   Trouble walking, fluid retention (edema).   Muscle weakness.   If your baby has decreased activity.   Persistent diarrhea.   Abnormal vaginal discharge.   Uterine contractions at 20-minute intervals.   Back pain that travels down your leg.  L Learn and practice that what you eat and drink should be in moderation and healthy for you and your baby. Legal drugs such as alcohol and caffeine are important issues for pregnant women. There is no safe amount of alcohol a woman can drink while pregnant. Fetal alcohol syndrome, a disorder characterized by growth retardation, facial abnormalities, and central nervous system dysfunction, is caused by a woman's use of alcohol during pregnancy. Caffeine, found in tea, coffee, soft drinks and chocolate, should also be limited. Be sure to read labels when trying to cut down on caffeine during pregnancy. More than 200 foods, beverages, and over-the-counter medications contain caffeine and have a high salt content! There are coffees and teas that do not contain caffeine. M Medical conditions such as diabetes, epilepsy, and high blood pressure should be treated and kept under control before pregnancy when possible, but especially during pregnancy. Ask your caregiver about any medications that may need to be changed or adjusted during pregnancy. If you are currently taking any medications, ask your caregiver if it  is safe to take them while you are pregnant or before getting pregnant when possible. Also, be sure to discuss any herbs or vitamins you are taking. They are medicines, too! Discuss with your doctor all medications, prescribed and over-the-counter, that you are taking. During your prenatal visit, discuss the medications your doctor may give you during labor and delivery. N Never be afraid to ask your doctor or caregiver questions about your health, the progress of the pregnancy, family problems, stressful situations, and recommendation for a pediatrician, if you do not have one. It is better to take all precautions and discuss any questions or concerns you may have during your office visits. It is a good idea to write down your questions before you visit the doctor. O Over-the-counter cough and cold remedies may contain alcohol or other ingredients that should be avoided during pregnancy. Ask your caregiver about prescription, herbs or over-the-counter medications that you are taking or may consider taking while pregnant.  P Physical activity during pregnancy can  benefit both you and your baby by lessening discomfort and fatigue, providing a sense of well-being, and increasing the likelihood of early recovery after delivery. Light to moderate exercise during pregnancy strengthens the belly (abdominal) and back muscles. This helps improve posture. Practicing yoga, walking, swimming, and cycling on a stationary bicycle are usually safe exercises for pregnant women. Avoid scuba diving, exercise at high altitudes (over 3000 feet), skiing, horseback riding, contact sports, etc. Always check with your doctor before beginning any kind of exercise, especially during pregnancy and especially if you did not exercise before getting pregnant. Q Queasiness, stomach upset and morning sickness are common during pregnancy. Eating a couple of crackers or dry toast before getting out of bed. Foods that you normally love may  make you feel sick to your stomach. You may need to substitute other nutritious foods. Eating 5 or 6 small meals a day instead of 3 large ones may make you feel better. Do not drink with your meals, drink between meals. Questions that you have should be written down and asked during your prenatal visits. R Read about and make plans to baby-proof your home. There are important tips for making your home a safer environment for your baby. Review the tips and make your home safer for you and your baby. Read food labels regarding calories, salt and fat content in the food. S Saunas, hot tubs, and steam rooms should be avoided while you are pregnant. Excessive high heat may be harmful during your pregnancy. Your caregiver will screen and examine you for sexually transmitted diseases and genetic disorders during your prenatal visits. Learn the signs of labor. Sexual relations while pregnant is safe unless there is a medical or pregnancy problem and your caregiver advises against it. T Traveling long distances should be avoided especially in the third trimester of your pregnancy. If you do have to travel out of state, be sure to take a copy of your medical records and medical insurance plan with you. You should not travel long distances without seeing your doctor first. Most airlines will not allow you to travel after 36 weeks of pregnancy. Toxoplasmosis is an infection caused by a parasite that can seriously harm an unborn baby. Avoid eating undercooked meat and handling cat litter. Be sure to wear gloves when gardening. Tingling of the hands and fingers is not unusual and is due to fluid retention. This will go away after the baby is born. U Womb (uterus) size increases during the first trimester. Your kidneys will begin to function more efficiently. This may cause you to feel the need to urinate more often. You may also leak urine when sneezing, coughing or laughing. This is due to the growing uterus pressing  against your bladder, which lies directly in front of and slightly under the uterus during the first few months of pregnancy. If you experience burning along with frequency of urination or bloody urine, be sure to tell your doctor. The size of your uterus in the third trimester may cause a problem with your balance. It is advisable to maintain good posture and avoid wearing high heels during this time. An ultrasound of your baby may be necessary during your pregnancy and is safe for you and your baby. V Vaccinations are an important concern for pregnant women. Get needed vaccines before pregnancy. Center for Disease Control (http://www.wolf.info/) has clear guidelines for the use of vaccines during pregnancy. Review the list, be sure to discuss it with your doctor. Prenatal vitamins are helpful  and healthy for you and the baby. Do not take extra vitamins except what is recommended. Taking too much of certain vitamins can cause overdose problems. Continuous vomiting should be reported to your caregiver. Varicose veins may appear especially if there is a family history of varicose veins. They should subside after the delivery of the baby. Support hose helps if there is leg discomfort. W Being overweight or underweight during pregnancy may cause problems. Try to get within 15 pounds of your ideal weight before pregnancy. Remember, pregnancy is not a time to be dieting! Do not stop eating or start skipping meals as your weight increases. Both you and your baby need the calories and nutrition you receive from a healthy diet. Be sure to consult with your doctor about your diet. There is a formula and diet plan available depending on whether you are overweight or underweight. Your caregiver or nutritionist can help and advise you if necessary. X Avoid X-rays. If you must have dental work or diagnostic tests, tell your dentist or physician that you are pregnant so that extra care can be taken. X-rays should only be taken when  the risks of not taking them outweigh the risk of taking them. If needed, only the minimum amount of radiation should be used. When X-rays are necessary, protective lead shields should be used to cover areas of the body that are not being X-rayed. Y Your baby loves you. Breastfeeding your baby creates a loving and very close bond between the two of you. Give your baby a healthy environment to live in while you are pregnant. Infants and children require constant care and guidance. Their health and safety should be carefully watched at all times. After the baby is born, rest or take a nap when the baby is sleeping. Z Get your ZZZs. Be sure to get plenty of rest. Resting on your side as often as possible, especially on your left side is advised. It provides the best circulation to your baby and helps reduce swelling. Try taking a nap for 30 to 45 minutes in the afternoon when possible. After the baby is born rest or take a nap when the baby is sleeping. Try elevating your feet for that amount of time when possible. It helps the circulation in your legs and helps reduce swelling.  Most information courtesy of the CDC. Document Released: 05/05/2005 Document Revised: 04/24/2011 Document Reviewed: 01/17/2009 [facility] Patient Information 2012 [place], [place].  How a Baby Grows During Pregnancy Pregnancy begins when the female's sperm enters the female's egg. This happens in the fallopian tube and is called fertilization. The fertilized egg is called an embryo until it reaches 9 weeks from the time of fertilization. From 9 weeks until birth it is called a fetus. The fertilized egg moves down the tube into the uterus and attaches to the inside lining of the uterus.  The pregnant woman is responsible for the growth of the embryo/fetus by supplying nourishment and oxygen through the blood stream and placenta to the developing fetus. The uterus becomes larger and pops out from the abdomen more and more as the fetus  develops and grows. A normal pregnancy lasts 280 days, with a range of 259 to 294 days, or 40 weeks. The pregnancy is divided up into three trimesters:  First trimester - 0 to 13 weeks.   Second trimester - 14 to 27 weeks.   Third trimester - 28 to 40 weeks.  The day your baby is supposed to be born is  called estimated date of confinement [facility]) or estimated date of delivery (EDD). GROWTH OF THE BABY MONTH BY MONTH 1. First Month: The fertilized egg attaches to the inside of the uterus and certain cells will form the placenta and others will develop into the fetus. The arms, legs, brain, spinal cord, lungs, and heart begin to develop. At the end of the first month the heart begins to beat. The embryo weighs less than an ounce and is  inch long.  2. Second Month: The bones can be seen, the inner ear, eye lids, hands and feet form and genitals develop. By the end of 8 weeks, all of the major organs are developing. The fetus now weighs less than an ounce and is one inch (2.54 cm) long.  3. Third Month: Teeth buds appear, all the internal organs are forming, bones and muscles begin to grow, the spine can flex and the skin is transparent. Finger and toe nails begin to form, the hands develop faster than the feet and the arms are longer than the legs at this point. The fetus weighs a little more than an ounce (0.03 kg) and is 3 inches (8.89cm) long.  4. Fourth Month: The placenta is completely formed. The external sex organs, neck, outer ear, eyebrows, eyelids and fingernails are formed. The fetus can hear, swallow, flex its arms and legs and the kidney begins to produce urine. The skin is covered with a white waxy coating (vernix) and very thin hair (lanugo) is present. The fetus weighs 5 ounces (0.14kg) and is 6 to 7 inches (16.51cm) long.  5. Fifth Month: The fetus moves around more and can be felt for the first time (called quickening), sleeps and wakes up at times, may begin to suck its finger and the  nails grow to the end of the fingers. The gallbladder is now functioning and helps to digest the nutrients, eggs are formed in the female and the testicles begin to drop down from the abdomen to the scrotum in the female. The fetus weighs  to 1 pound (0.45kg) and is 10 inches (25.4cm) long.  6. Sixth Month: The lungs are formed but the fetus does not breath yet. The eyes open, the brain develops more quickly at this time, one can detect finger and toe prints and thicker hair grows. The fetus weighs 1 to 1 pounds (0.68kg) and is 12 inches (30.48cm) long.  7. Seventh Month: The fetus can hear and respond to sounds, kicks and stretches and can sense changes in light. The fetus weighs 2 to 2 pounds (1.13kg) and is 14 inches (35.56cm) long.  8. Eight Month: All organs and body systems are fully developed and functioning. The bones get harder, taste buds develop and can taste sweet and sour flavors and the fetus may hiccup now. Different parts of the brain are developing and the skull remains soft for the brain to grow. The fetus weighs 5 pounds (2.27kg) and is 18 inches (45.75cm) long.  9. Ninth Month: The fetus gains about a half a pound a week, the lungs are fully developed, patterns of sleep develop and the head moves down into the bottom of the uterus called vertex. If the buttocks moves into the bottom of the uterus, it is called a breech. The fetus weighs 6 to 9 pounds (2.72 to 4.08kg) and is 20 inches (50.8cm) long.  You should be informed about your pregnancy, yourself and how the baby is developing as much as possible. Being informed helps you to  enjoy this experience. It also gives you the sense to feel if something is not going right and when to ask questions. Talk to your caregiver when you have questions about your baby or your own body. Document Released: 10/22/2007 Document Revised: 04/24/2011 Document Reviewed: 10/22/2007 [facility] Patient Information 2012 [place], [place].  Hyperemesis  Gravidarum Hyperemesis gravidarum is a severe form of nausea and vomiting that happens during pregnancy. Hyperemesis is worse than morning sickness. It may cause a woman to have nausea or vomiting all day for many days. It may keep a woman from eating and drinking enough food and liquids. Hyperemesis usually occurs during the first half (the first 20 weeks) of pregnancy. It often goes away once a woman is in her second half of pregnancy. However, sometimes hyperemesis continues through an entire pregnancy.  CAUSES  The cause of this condition is not completely known but is thought to be due to changes in the body's hormones when pregnant. It could be the high level of the pregnancy hormone or an increase in estrogen in the body.  SYMPTOMS   Severe nausea and vomiting.   Nausea that does not go away.   Vomiting that does not allow you to keep any food down.   Weight loss and body fluid loss (dehydration).   Having no desire to eat or not liking food you have previously enjoyed.  DIAGNOSIS  Your caregiver may ask you about your symptoms. Your caregiver may also order blood tests and urine tests to make sure something else is not causing the problem.  TREATMENT  You may only need medicine to control the problem. If medicines do not control the nausea and vomiting, you will be treated in the hospital to prevent dehydration, acidosis, weight loss, and changes in the electrolytes in your body that may harm the unborn baby (fetus). You may need intravenous (IV) fluids.  HOME CARE INSTRUCTIONS   Take all medicine as directed by your caregiver.   Try eating a couple of dry crackers or toast in the morning before getting out of bed.   Avoid foods and smells that upset your stomach.   Avoid fatty and spicy foods. Eat 5 to 6 small meals a day.   Do not drink when eating meals. Drink between meals.   For snacks, eat high protein foods, such as cheese. Eat or suck on things that have ginger in them.  Ginger helps nausea.   Avoid food preparation. The smell of food can spoil your appetite.   Avoid iron pills and iron in your multivitamins until after 3 to 4 months of being pregnant.  SEEK MEDICAL CARE IF:   Your abdominal pain increases since the last time you saw your caregiver.   You have a severe headache.   You develop vision problems.   You feel you are losing weight.  SEEK IMMEDIATE MEDICAL CARE IF:   You are unable to keep fluids down.   You vomit blood.   You have constant nausea and vomiting.   You have a fever.   You have excessive weakness, dizziness, fainting, or extreme thirst.  MAKE SURE YOU:   Understand these instructions.   Will watch your condition.   Will get help right away if you are not doing well or get worse.  Document Released: 05/05/2005 Document Revised: 04/24/2011 Document Reviewed: 08/05/2010 [facility] Patient Information 2012 [place], [place].  Pregnancy - Second Trimester The second trimester of pregnancy (3 to 6 months) is a period of rapid growth  for you and your baby. At the end of the sixth month, your baby is about 9 inches long and weighs 1 1/2 pounds. You will begin to feel the baby move between 18 and 20 weeks of the pregnancy. This is called quickening. Weight gain is faster. A clear fluid (colostrum) may leak out of your breasts. You may feel small contractions of the womb (uterus). This is known as false labor or Braxton-Hicks contractions. This is like a practice for labor when the baby is ready to be born. Usually, the problems with morning sickness have usually passed by the end of your first trimester. Some women develop small dark blotches (called cholasma, mask of pregnancy) on their face that usually goes away after the baby is born. Exposure to the sun makes the blotches worse. Acne may also develop in some pregnant women and pregnant women who have acne, may find that it goes away. PRENATAL EXAMS  Blood work may continue to be  done during prenatal exams. These tests are done to check on your health and the probable health of your baby. Blood work is used to follow your blood levels (hemoglobin). Anemia (low hemoglobin) is common during pregnancy. Iron and vitamins are given to help prevent this. You will also be checked for diabetes between 24 and 28 weeks of the pregnancy. Some of the previous blood tests may be repeated.   The size of the uterus is measured during each visit. This is to make sure that the baby is continuing to grow properly according to the dates of the pregnancy.   Your blood pressure is checked every prenatal visit. This is to make sure you are not getting toxemia.   Your urine is checked to make sure you do not have an infection, diabetes or protein in the urine.   Your weight is checked often to make sure gains are happening at the suggested rate. This is to ensure that both you and your baby are growing normally.   Sometimes, an ultrasound is performed to confirm the proper growth and development of the baby. This is a test which bounces harmless sound waves off the baby so your caregiver can more accurately determine due dates.  Sometimes, a specialized test is done on the amniotic fluid surrounding the baby. This test is called an amniocentesis. The amniotic fluid is obtained by sticking a needle into the belly (abdomen). This is done to check the chromosomes in instances where there is a concern about possible genetic problems with the baby. It is also sometimes done near the end of pregnancy if an early delivery is required. In this case, it is done to help make sure the baby's lungs are mature enough for the baby to live outside of the womb. CHANGES OCCURING IN THE SECOND TRIMESTER OF PREGNANCY Your body goes through many changes during pregnancy. They vary from person to person. Talk to your caregiver about changes you notice that you are concerned about.  During the second trimester, you will  likely have an increase in your appetite. It is normal to have cravings for certain foods. This varies from person to person and pregnancy to pregnancy.   Your lower abdomen will begin to bulge.   You may have to urinate more often because the uterus and baby are pressing on your bladder. It is also common to get more bladder infections during pregnancy (pain with urination). You can help this by drinking lots of fluids and emptying your bladder before and  after intercourse.   You may begin to get stretch marks on your hips, abdomen, and breasts. These are normal changes in the body during pregnancy. There are no exercises or medications to take that prevent this change.   You may begin to develop swollen and bulging veins (varicose veins) in your legs. Wearing support hose, elevating your feet for 15 minutes, 3 to 4 times a day and limiting salt in your diet helps lessen the problem.   Heartburn may develop as the uterus grows and pushes up against the stomach. Antacids recommended by your caregiver helps with this problem. Also, eating smaller meals 4 to 5 times a day helps.   Constipation can be treated with a stool softener or adding bulk to your diet. Drinking lots of fluids, vegetables, fruits, and whole grains are helpful.   Exercising is also helpful. If you have been very active up until your pregnancy, most of these activities can be continued during your pregnancy. If you have been less active, it is helpful to start an exercise program such as walking.   Hemorrhoids (varicose veins in the rectum) may develop at the end of the second trimester. Warm sitz baths and hemorrhoid cream recommended by your caregiver helps hemorrhoid problems.   Backaches may develop during this time of your pregnancy. Avoid heavy lifting, wear low heal shoes and practice good posture to help with backache problems.   Some pregnant women develop tingling and numbness of their hand and fingers because of  swelling and tightening of ligaments in the wrist (carpel tunnel syndrome). This goes away after the baby is born.   As your breasts enlarge, you may have to get a bigger bra. Get a comfortable, cotton, support bra. Do not get a nursing bra until the last month of the pregnancy if you will be nursing the baby.   You may get a dark line from your belly button to the pubic area called the linea nigra.   You may develop rosy cheeks because of increase blood flow to the face.   You may develop spider looking lines of the face, neck, arms and chest. These go away after the baby is born.  HOME CARE INSTRUCTIONS   It is extremely important to avoid all smoking, herbs, alcohol, and unprescribed drugs during your pregnancy. These chemicals affect the formation and growth of the baby. Avoid these chemicals throughout the pregnancy to ensure the delivery of a healthy infant.   Most of your home care instructions are the same as suggested for the first trimester of your pregnancy. Keep your caregiver's appointments. Follow your caregiver's instructions regarding medication use, exercise and diet.   During pregnancy, you are providing food for you and your baby. Continue to eat regular, well-balanced meals. Choose foods such as meat, fish, milk and other low fat dairy products, vegetables, fruits, and whole-grain breads and cereals. Your caregiver will tell you of the ideal weight gain.   A physical sexual relationship may be continued up until near the end of pregnancy if there are no other problems. Problems could include early (premature) leaking of amniotic fluid from the membranes, vaginal bleeding, abdominal pain, or other medical or pregnancy problems.   Exercise regularly if there are no restrictions. Check with your caregiver if you are unsure of the safety of some of your exercises. The greatest weight gain will occur in the last 2 trimesters of pregnancy. Exercise will help you:   Control your  weight.   Get you  in shape for labor and delivery.   Lose weight after you have the baby.   Wear a good support or jogging bra for breast tenderness during pregnancy. This may help if worn during sleep. Pads or tissues may be used in the bra if you are leaking colostrum.   Do not use hot tubs, steam rooms or saunas throughout the pregnancy.   Wear your seat belt at all times when driving. This protects you and your baby if you are in an accident.   Avoid raw meat, uncooked cheese, cat litter boxes and soil used by cats. These carry germs that can cause birth defects in the baby.   The second trimester is also a good time to visit your dentist for your dental health if this has not been done yet. Getting your teeth cleaned is OK. Use a soft toothbrush. Brush gently during pregnancy.   It is easier to loose urine during pregnancy. Tightening up and strengthening the pelvic muscles will help with this problem. Practice stopping your urination while you are going to the bathroom. These are the same muscles you need to strengthen. It is also the muscles you would use as if you were trying to stop from passing gas. You can practice tightening these muscles up 10 times a set and repeating this about 3 times per day. Once you know what muscles to tighten up, do not perform these exercises during urination. It is more likely to contribute to an infection by backing up the urine.   Ask for help if you have financial, counseling or nutritional needs during pregnancy. Your caregiver will be able to offer counseling for these needs as well as refer you for other special needs.   Your skin may become oily. If so, wash your face with mild soap, use non-greasy moisturizer and oil or cream based makeup.  MEDICATIONS AND DRUG USE IN PREGNANCY  Take prenatal vitamins as directed. The vitamin should contain 1 milligram of folic acid. Keep all vitamins out of reach of children. Only a couple vitamins or tablets  containing iron may be fatal to a baby or young child when ingested.   Avoid use of all medications, including herbs, over-the-counter medications, not prescribed or suggested by your caregiver. Only take over-the-counter or prescription medicines for pain, discomfort, or fever as directed by your caregiver. Do not use aspirin.   Let your caregiver also know about herbs you may be using.   Alcohol is related to a number of birth defects. This includes fetal alcohol syndrome. All alcohol, in any form, should be avoided completely. Smoking will cause low birth rate and premature babies.   Street or illegal drugs are very harmful to the baby. They are absolutely forbidden. A baby born to an addicted mother will be addicted at birth. The baby will go through the same withdrawal an adult does.  SEEK MEDICAL CARE IF:  You have any concerns or worries during your pregnancy. It is better to call with your questions if you feel they cannot wait, rather than worry about them. SEEK IMMEDIATE MEDICAL CARE IF:   An unexplained oral temperature above 102 F (38.9 C) develops, or as your caregiver suggests.   You have leaking of fluid from the vagina (birth canal). If leaking membranes are suspected, take your temperature and tell your caregiver of this when you call.   There is vaginal spotting, bleeding, or passing clots. Tell your caregiver of the amount and how many pads are used.  Light spotting in pregnancy is common, especially following intercourse.   You develop a bad smelling vaginal discharge with a change in the color from clear to white.   You continue to feel sick to your stomach (nauseated) and have no relief from remedies suggested. You vomit blood or coffee ground-like materials.   You lose more than 2 pounds of weight or gain more than 2 pounds of weight over 1 week, or as suggested by your caregiver.   You notice swelling of your face, hands, feet, or legs.   You get exposed to [place]  measles and have never had them.   You are exposed to fifth disease or chickenpox.   You develop belly (abdominal) pain. Round ligament discomfort is a common non-cancerous (benign) cause of abdominal pain in pregnancy. Your caregiver still must evaluate you.   You develop a bad headache that does not go away.   You develop fever, diarrhea, pain with urination, or shortness of breath.   You develop visual problems, blurry, or double vision.   You fall or are in a car accident or any kind of trauma.   There is mental or physical violence at home.  Document Released: 04/29/2001 Document Revised: 04/24/2011 Document Reviewed: 11/01/2008 [facility] Patient Information 2012 [place], [place].

## 2011-11-06 LAB — OB RESULTS CONSOLE ANTIBODY SCREEN: Antibody Screen: NEGATIVE

## 2011-11-06 LAB — OB RESULTS CONSOLE HEPATITIS B SURFACE ANTIGEN: Hepatitis B Surface Ag: NEGATIVE

## 2011-11-06 LAB — OB RESULTS CONSOLE HIV ANTIBODY (ROUTINE TESTING): HIV: NONREACTIVE

## 2011-11-06 LAB — OB RESULTS CONSOLE RPR: RPR: NONREACTIVE

## 2011-11-07 ENCOUNTER — Other Ambulatory Visit: Payer: Self-pay | Admitting: Obstetrics

## 2011-11-07 DIAGNOSIS — Z3689 Encounter for other specified antenatal screening: Secondary | ICD-10-CM

## 2011-11-11 ENCOUNTER — Ambulatory Visit (HOSPITAL_COMMUNITY)
Admission: RE | Admit: 2011-11-11 | Discharge: 2011-11-11 | Disposition: A | Discharge status: Home / Self Care | Payer: 59 | Source: Ambulatory Visit | Attending: Obstetrics | Admitting: Obstetrics

## 2011-11-11 DIAGNOSIS — Z1389 Encounter for screening for other disorder: Secondary | ICD-10-CM | POA: Insufficient documentation

## 2011-11-11 DIAGNOSIS — Z3689 Encounter for other specified antenatal screening: Secondary | ICD-10-CM

## 2011-11-11 DIAGNOSIS — O34219 Maternal care for unspecified type scar from previous cesarean delivery: Secondary | ICD-10-CM | POA: Insufficient documentation

## 2011-11-11 DIAGNOSIS — O358XX0 Maternal care for other (suspected) fetal abnormality and damage, not applicable or unspecified: Secondary | ICD-10-CM | POA: Insufficient documentation

## 2011-11-11 DIAGNOSIS — Z363 Encounter for antenatal screening for malformations: Secondary | ICD-10-CM | POA: Insufficient documentation

## 2011-12-04 ENCOUNTER — Other Ambulatory Visit: Payer: Self-pay | Admitting: Obstetrics

## 2011-12-04 DIAGNOSIS — Z0489 Encounter for examination and observation for other specified reasons: Secondary | ICD-10-CM

## 2011-12-08 ENCOUNTER — Ambulatory Visit (HOSPITAL_COMMUNITY)
Admission: RE | Admit: 2011-12-08 | Discharge: 2011-12-08 | Disposition: A | Discharge status: Home / Self Care | Payer: 59 | Source: Ambulatory Visit | Attending: Obstetrics | Admitting: Obstetrics

## 2011-12-08 DIAGNOSIS — Z0489 Encounter for examination and observation for other specified reasons: Secondary | ICD-10-CM

## 2011-12-08 DIAGNOSIS — O34219 Maternal care for unspecified type scar from previous cesarean delivery: Secondary | ICD-10-CM | POA: Insufficient documentation

## 2011-12-08 DIAGNOSIS — O4400 Placenta previa specified as without hemorrhage, unspecified trimester: Secondary | ICD-10-CM | POA: Insufficient documentation

## 2012-03-29 ENCOUNTER — Inpatient Hospital Stay (HOSPITAL_COMMUNITY)
Admission: AD | Admit: 2012-03-29 | Discharge: 2012-03-31 | DRG: 766 | Disposition: A | Discharge status: Home / Self Care | Payer: 59 | Source: Ambulatory Visit | Attending: Obstetrics & Gynecology | Admitting: Obstetrics & Gynecology

## 2012-03-29 ENCOUNTER — Encounter (HOSPITAL_COMMUNITY): Payer: Self-pay | Admitting: *Deleted

## 2012-03-29 ENCOUNTER — Encounter (HOSPITAL_COMMUNITY): Payer: Self-pay | Admitting: Anesthesiology

## 2012-03-29 ENCOUNTER — Encounter (HOSPITAL_COMMUNITY)
Admission: AD | Disposition: A | Discharge status: Home / Self Care | Payer: Self-pay | Source: Ambulatory Visit | Attending: Obstetrics & Gynecology

## 2012-03-29 ENCOUNTER — Inpatient Hospital Stay (HOSPITAL_COMMUNITY): Payer: 59 | Admitting: Anesthesiology

## 2012-03-29 DIAGNOSIS — Z98891 History of uterine scar from previous surgery: Secondary | ICD-10-CM

## 2012-03-29 DIAGNOSIS — O34219 Maternal care for unspecified type scar from previous cesarean delivery: Secondary | ICD-10-CM | POA: Diagnosis present

## 2012-03-29 DIAGNOSIS — O324XX0 Maternal care for high head at term, not applicable or unspecified: Secondary | ICD-10-CM | POA: Diagnosis present

## 2012-03-29 DIAGNOSIS — IMO0001 Reserved for inherently not codable concepts without codable children: Secondary | ICD-10-CM

## 2012-03-29 LAB — POCT FERN TEST: POCT Fern Test: POSITIVE

## 2012-03-29 LAB — CBC
HCT: 36.8 % (ref 36.0–46.0)
MCH: 31.8 pg (ref 26.0–34.0)
MCV: 93.6 fL (ref 78.0–100.0)
Platelets: 217 10*3/uL (ref 150–400)
RBC: 3.93 MIL/uL (ref 3.87–5.11)
WBC: 12.2 10*3/uL — ABNORMAL HIGH (ref 4.0–10.5)

## 2012-03-29 LAB — PREPARE RBC (CROSSMATCH)

## 2012-03-29 LAB — ABO/RH: ABO/RH(D): O POS

## 2012-03-29 SURGERY — Surgical Case
Anesthesia: Epidural | Site: Abdomen | Laterality: Bilateral | Wound class: Clean Contaminated

## 2012-03-29 MED ORDER — SENNOSIDES-DOCUSATE SODIUM 8.6-50 MG PO TABS
2.0000 | ORAL_TABLET | Freq: Every day | ORAL | Status: DC
  Administered 2012-03-29 – 2012-03-30 (×2): 2 via ORAL

## 2012-03-29 MED ORDER — FENTANYL CITRATE 0.05 MG/ML IJ SOLN
25.0000 ug | INTRAMUSCULAR | Status: DC | PRN
  Administered 2012-03-29 (×4): 50 ug via INTRAVENOUS

## 2012-03-29 MED ORDER — DIPHENHYDRAMINE HCL 50 MG/ML IJ SOLN: 12.5000 mg | INTRAMUSCULAR | Status: DC | PRN

## 2012-03-29 MED ORDER — MIDAZOLAM HCL 2 MG/2ML IJ SOLN: 0.5000 mg | Freq: Once | INTRAMUSCULAR | Status: DC | PRN

## 2012-03-29 MED ORDER — KETOROLAC TROMETHAMINE 30 MG/ML IJ SOLN
30.0000 mg | Freq: Four times a day (QID) | INTRAMUSCULAR | Status: AC | PRN
  Administered 2012-03-29 (×2): 30 mg via INTRAVENOUS
  Filled 2012-03-29: qty 1

## 2012-03-29 MED ORDER — OXYTOCIN 10 UNIT/ML IJ SOLN
40.0000 [IU] | INTRAVENOUS | Status: DC | PRN
  Administered 2012-03-29: 40 [IU] via INTRAVENOUS

## 2012-03-29 MED ORDER — TETANUS-DIPHTH-ACELL PERTUSSIS 5-2.5-18.5 LF-MCG/0.5 IM SUSP: 0.5000 mL | Freq: Once | INTRAMUSCULAR | Status: DC

## 2012-03-29 MED ORDER — OXYTOCIN 40 UNITS IN LACTATED RINGERS INFUSION - SIMPLE MED: 62.5000 mL/h | INTRAVENOUS | Status: AC

## 2012-03-29 MED ORDER — METOCLOPRAMIDE HCL 5 MG/ML IJ SOLN: 10.0000 mg | Freq: Three times a day (TID) | INTRAMUSCULAR | Status: DC | PRN

## 2012-03-29 MED ORDER — MEPERIDINE HCL 25 MG/ML IJ SOLN: 6.2500 mg | INTRAMUSCULAR | Status: DC | PRN

## 2012-03-29 MED ORDER — DIPHENHYDRAMINE HCL 25 MG PO CAPS: 25.0000 mg | ORAL_CAPSULE | Freq: Four times a day (QID) | ORAL | Status: DC | PRN

## 2012-03-29 MED ORDER — LIDOCAINE-EPINEPHRINE 2 %-1:100000 IJ SOLN: INTRAMUSCULAR | Status: DC | PRN

## 2012-03-29 MED ORDER — SODIUM BICARBONATE 8.4 % IV SOLN
INTRAVENOUS | Status: DC | PRN
  Administered 2012-03-29: 5 mL via EPIDURAL

## 2012-03-29 MED ORDER — CEFAZOLIN SODIUM-DEXTROSE 2-3 GM-% IV SOLR
INTRAVENOUS | Status: DC | PRN
  Administered 2012-03-29: 2 g via INTRAVENOUS

## 2012-03-29 MED ORDER — DIPHENHYDRAMINE HCL 25 MG PO CAPS: 25.0000 mg | ORAL_CAPSULE | ORAL | Status: DC | PRN

## 2012-03-29 MED ORDER — ZOLPIDEM TARTRATE 5 MG PO TABS: 5.0000 mg | ORAL_TABLET | Freq: Every evening | ORAL | Status: DC | PRN

## 2012-03-29 MED ORDER — LACTATED RINGERS IV SOLN: 500.0000 mL | INTRAVENOUS | Status: DC | PRN

## 2012-03-29 MED ORDER — KETOROLAC TROMETHAMINE 30 MG/ML IJ SOLN
INTRAMUSCULAR | Status: AC
  Administered 2012-03-29: 30 mg via INTRAVENOUS
  Filled 2012-03-29: qty 1

## 2012-03-29 MED ORDER — OXYTOCIN BOLUS FROM INFUSION: 500.0000 mL | INTRAVENOUS | Status: DC

## 2012-03-29 MED ORDER — ONDANSETRON HCL 4 MG PO TABS: 4.0000 mg | ORAL_TABLET | ORAL | Status: DC | PRN

## 2012-03-29 MED ORDER — ONDANSETRON HCL 4 MG/2ML IJ SOLN: 4.0000 mg | Freq: Three times a day (TID) | INTRAMUSCULAR | Status: DC | PRN

## 2012-03-29 MED ORDER — PHENYLEPHRINE 40 MCG/ML (10ML) SYRINGE FOR IV PUSH (FOR BLOOD PRESSURE SUPPORT)
80.0000 ug | PREFILLED_SYRINGE | INTRAVENOUS | Status: DC | PRN
  Filled 2012-03-29: qty 2
  Filled 2012-03-29: qty 5

## 2012-03-29 MED ORDER — ONDANSETRON HCL 4 MG/2ML IJ SOLN
INTRAMUSCULAR | Status: AC
  Filled 2012-03-29: qty 2

## 2012-03-29 MED ORDER — SCOPOLAMINE 1 MG/3DAYS TD PT72
1.0000 | MEDICATED_PATCH | Freq: Once | TRANSDERMAL | Status: DC
  Administered 2012-03-29: 1.5 mg via TRANSDERMAL

## 2012-03-29 MED ORDER — PHENYLEPHRINE 40 MCG/ML (10ML) SYRINGE FOR IV PUSH (FOR BLOOD PRESSURE SUPPORT)
80.0000 ug | PREFILLED_SYRINGE | INTRAVENOUS | Status: DC | PRN
  Filled 2012-03-29: qty 2

## 2012-03-29 MED ORDER — FENTANYL 2.5 MCG/ML BUPIVACAINE 1/10 % EPIDURAL INFUSION (WH - ANES)
INTRAMUSCULAR | Status: DC | PRN
  Administered 2012-03-29: 14 mL/h via EPIDURAL

## 2012-03-29 MED ORDER — LACTATED RINGERS IV SOLN: INTRAVENOUS | Status: DC

## 2012-03-29 MED ORDER — CEFAZOLIN SODIUM-DEXTROSE 2-3 GM-% IV SOLR
INTRAVENOUS | Status: AC
  Filled 2012-03-29: qty 50

## 2012-03-29 MED ORDER — MENTHOL 3 MG MT LOZG: 1.0000 | LOZENGE | OROMUCOSAL | Status: DC | PRN

## 2012-03-29 MED ORDER — ONDANSETRON HCL 4 MG/2ML IJ SOLN: 4.0000 mg | INTRAMUSCULAR | Status: DC | PRN

## 2012-03-29 MED ORDER — DIBUCAINE 1 % RE OINT: 1.0000 "application " | TOPICAL_OINTMENT | RECTAL | Status: DC | PRN

## 2012-03-29 MED ORDER — MORPHINE SULFATE (PF) 0.5 MG/ML IJ SOLN
INTRAMUSCULAR | Status: DC | PRN
  Administered 2012-03-29: 4 mg via EPIDURAL

## 2012-03-29 MED ORDER — SIMETHICONE 80 MG PO CHEW
80.0000 mg | CHEWABLE_TABLET | Freq: Three times a day (TID) | ORAL | Status: DC
  Administered 2012-03-29 – 2012-03-31 (×6): 80 mg via ORAL

## 2012-03-29 MED ORDER — FENTANYL CITRATE 0.05 MG/ML IJ SOLN
INTRAMUSCULAR | Status: AC
  Administered 2012-03-29: 50 ug via INTRAVENOUS
  Filled 2012-03-29: qty 2

## 2012-03-29 MED ORDER — NALOXONE HCL 0.4 MG/ML IJ SOLN: 0.4000 mg | INTRAMUSCULAR | Status: DC | PRN

## 2012-03-29 MED ORDER — MEDROXYPROGESTERONE ACETATE 150 MG/ML IM SUSP: 150.0000 mg | INTRAMUSCULAR | Status: DC | PRN

## 2012-03-29 MED ORDER — LIDOCAINE HCL (PF) 1 % IJ SOLN: 30.0000 mL | INTRAMUSCULAR | Status: DC | PRN

## 2012-03-29 MED ORDER — LACTATED RINGERS IV SOLN
INTRAVENOUS | Status: DC
  Administered 2012-03-29 (×5): via INTRAVENOUS

## 2012-03-29 MED ORDER — DEXTROSE 5 % IV SOLN
2.0000 g | Freq: Four times a day (QID) | INTRAVENOUS | Status: AC
  Administered 2012-03-29 – 2012-03-30 (×5): 2 g via INTRAVENOUS
  Filled 2012-03-29 (×5): qty 2

## 2012-03-29 MED ORDER — FLEET ENEMA 7-19 GM/118ML RE ENEM: 1.0000 | ENEMA | RECTAL | Status: DC | PRN

## 2012-03-29 MED ORDER — ACETAMINOPHEN 325 MG PO TABS: 650.0000 mg | ORAL_TABLET | ORAL | Status: DC | PRN

## 2012-03-29 MED ORDER — OXYCODONE-ACETAMINOPHEN 5-325 MG PO TABS: 1.0000 | ORAL_TABLET | ORAL | Status: DC | PRN

## 2012-03-29 MED ORDER — PRENATAL MULTIVITAMIN CH
1.0000 | ORAL_TABLET | Freq: Every day | ORAL | Status: DC
  Administered 2012-03-30 – 2012-03-31 (×2): 1 via ORAL
  Filled 2012-03-29 (×2): qty 1

## 2012-03-29 MED ORDER — PHENYLEPHRINE 40 MCG/ML (10ML) SYRINGE FOR IV PUSH (FOR BLOOD PRESSURE SUPPORT)
PREFILLED_SYRINGE | INTRAVENOUS | Status: AC
  Filled 2012-03-29: qty 10

## 2012-03-29 MED ORDER — OXYTOCIN 40 UNITS IN LACTATED RINGERS INFUSION - SIMPLE MED: 62.5000 mL/h | INTRAVENOUS | Status: DC

## 2012-03-29 MED ORDER — KETOROLAC TROMETHAMINE 30 MG/ML IJ SOLN: 30.0000 mg | Freq: Four times a day (QID) | INTRAMUSCULAR | Status: AC | PRN

## 2012-03-29 MED ORDER — LIDOCAINE HCL (PF) 1 % IJ SOLN
INTRAMUSCULAR | Status: AC
  Filled 2012-03-29: qty 30

## 2012-03-29 MED ORDER — SIMETHICONE 80 MG PO CHEW: 80.0000 mg | CHEWABLE_TABLET | ORAL | Status: DC | PRN

## 2012-03-29 MED ORDER — OXYTOCIN 10 UNIT/ML IJ SOLN
INTRAMUSCULAR | Status: AC
  Filled 2012-03-29: qty 1

## 2012-03-29 MED ORDER — PROMETHAZINE HCL 25 MG/ML IJ SOLN: 6.2500 mg | INTRAMUSCULAR | Status: DC | PRN

## 2012-03-29 MED ORDER — LACTATED RINGERS IV SOLN
INTRAVENOUS | Status: DC
  Administered 2012-03-29: 23:00:00 via INTRAVENOUS

## 2012-03-29 MED ORDER — LACTATED RINGERS IV SOLN: 500.0000 mL | Freq: Once | INTRAVENOUS | Status: DC

## 2012-03-29 MED ORDER — EPHEDRINE 5 MG/ML INJ
10.0000 mg | INTRAVENOUS | Status: DC | PRN
  Filled 2012-03-29: qty 2

## 2012-03-29 MED ORDER — PHENYLEPHRINE HCL 10 MG/ML IJ SOLN
INTRAMUSCULAR | Status: DC | PRN
  Administered 2012-03-29: 80 ug via INTRAVENOUS
  Administered 2012-03-29 (×2): 40 ug via INTRAVENOUS
  Administered 2012-03-29 (×3): 80 ug via INTRAVENOUS

## 2012-03-29 MED ORDER — OXYTOCIN 40 UNITS IN LACTATED RINGERS INFUSION - SIMPLE MED
INTRAVENOUS | Status: AC
  Filled 2012-03-29: qty 1000

## 2012-03-29 MED ORDER — LIDOCAINE-EPINEPHRINE (PF) 2 %-1:200000 IJ SOLN
INTRAMUSCULAR | Status: AC
  Filled 2012-03-29: qty 20

## 2012-03-29 MED ORDER — LACTATED RINGERS IV SOLN
INTRAVENOUS | Status: DC | PRN
  Administered 2012-03-29 (×2): via INTRAVENOUS

## 2012-03-29 MED ORDER — DIPHENHYDRAMINE HCL 50 MG/ML IJ SOLN: 25.0000 mg | INTRAMUSCULAR | Status: DC | PRN

## 2012-03-29 MED ORDER — LANOLIN HYDROUS EX OINT: 1.0000 "application " | TOPICAL_OINTMENT | CUTANEOUS | Status: DC | PRN

## 2012-03-29 MED ORDER — LIDOCAINE HCL (PF) 1 % IJ SOLN
INTRAMUSCULAR | Status: DC | PRN
  Administered 2012-03-29 (×2): 4 mL

## 2012-03-29 MED ORDER — CITRIC ACID-SODIUM CITRATE 334-500 MG/5ML PO SOLN
30.0000 mL | ORAL | Status: DC | PRN
  Administered 2012-03-29: 30 mL via ORAL
  Filled 2012-03-29: qty 15

## 2012-03-29 MED ORDER — SODIUM BICARBONATE 8.4 % IV SOLN
INTRAVENOUS | Status: AC
  Filled 2012-03-29: qty 50

## 2012-03-29 MED ORDER — NALBUPHINE HCL 10 MG/ML IJ SOLN: 5.0000 mg | INTRAMUSCULAR | Status: DC | PRN

## 2012-03-29 MED ORDER — FENTANYL 2.5 MCG/ML BUPIVACAINE 1/10 % EPIDURAL INFUSION (WH - ANES)
14.0000 mL/h | INTRAMUSCULAR | Status: DC
  Administered 2012-03-29: 14 mL/h via EPIDURAL
  Filled 2012-03-29 (×2): qty 125

## 2012-03-29 MED ORDER — OXYCODONE-ACETAMINOPHEN 5-325 MG PO TABS
1.0000 | ORAL_TABLET | ORAL | Status: DC | PRN
  Administered 2012-03-30: 1 via ORAL
  Administered 2012-03-30 – 2012-03-31 (×6): 2 via ORAL
  Filled 2012-03-29: qty 2
  Filled 2012-03-29 (×2): qty 1
  Filled 2012-03-29 (×2): qty 2
  Filled 2012-03-29: qty 1
  Filled 2012-03-29: qty 2

## 2012-03-29 MED ORDER — SODIUM CHLORIDE 0.9 % IJ SOLN: 3.0000 mL | INTRAMUSCULAR | Status: DC | PRN

## 2012-03-29 MED ORDER — SCOPOLAMINE 1 MG/3DAYS TD PT72
MEDICATED_PATCH | TRANSDERMAL | Status: AC
  Administered 2012-03-29: 1.5 mg via TRANSDERMAL
  Filled 2012-03-29: qty 1

## 2012-03-29 MED ORDER — INFLUENZA VIRUS VACC SPLIT PF IM SUSP
0.5000 mL | INTRAMUSCULAR | Status: AC
  Administered 2012-03-30: 0.5 mL via INTRAMUSCULAR

## 2012-03-29 MED ORDER — MORPHINE SULFATE 0.5 MG/ML IJ SOLN
INTRAMUSCULAR | Status: AC
  Filled 2012-03-29: qty 10

## 2012-03-29 MED ORDER — FENTANYL CITRATE 0.05 MG/ML IJ SOLN
INTRAMUSCULAR | Status: AC
  Filled 2012-03-29: qty 2

## 2012-03-29 MED ORDER — ONDANSETRON HCL 4 MG/2ML IJ SOLN
INTRAMUSCULAR | Status: DC | PRN
  Administered 2012-03-29: 4 mg via INTRAVENOUS

## 2012-03-29 MED ORDER — IBUPROFEN 600 MG PO TABS
600.0000 mg | ORAL_TABLET | Freq: Four times a day (QID) | ORAL | Status: DC
  Administered 2012-03-30 – 2012-03-31 (×5): 600 mg via ORAL
  Filled 2012-03-29 (×5): qty 1

## 2012-03-29 MED ORDER — IBUPROFEN 600 MG PO TABS: 600.0000 mg | ORAL_TABLET | Freq: Four times a day (QID) | ORAL | Status: DC | PRN

## 2012-03-29 MED ORDER — LACTATED RINGERS IV SOLN
INTRAVENOUS | Status: DC | PRN
  Administered 2012-03-29: 14:00:00 via INTRAVENOUS

## 2012-03-29 MED ORDER — ACETAMINOPHEN 10 MG/ML IV SOLN: 1000.0000 mg | Freq: Four times a day (QID) | INTRAVENOUS | Status: AC | PRN

## 2012-03-29 MED ORDER — EPHEDRINE 5 MG/ML INJ
10.0000 mg | INTRAVENOUS | Status: DC | PRN
  Filled 2012-03-29: qty 2
  Filled 2012-03-29: qty 4

## 2012-03-29 MED ORDER — ONDANSETRON HCL 4 MG/2ML IJ SOLN: 4.0000 mg | Freq: Four times a day (QID) | INTRAMUSCULAR | Status: DC | PRN

## 2012-03-29 MED ORDER — SODIUM CHLORIDE 0.9 % IV SOLN: 1.0000 ug/kg/h | INTRAVENOUS | Status: DC | PRN

## 2012-03-29 MED ORDER — WITCH HAZEL-GLYCERIN EX PADS: 1.0000 "application " | MEDICATED_PAD | CUTANEOUS | Status: DC | PRN

## 2012-03-29 SURGICAL SUPPLY — 41 items
CANISTER WOUND CARE 500ML ATS (WOUND CARE) IMPLANT
CLOTH BEACON ORANGE TIMEOUT ST (SAFETY) ×2 IMPLANT
CONTAINER PREFILL 10% NBF 15ML (MISCELLANEOUS) ×4 IMPLANT
DERMABOND ADVANCED (GAUZE/BANDAGES/DRESSINGS) ×1
DERMABOND ADVANCED .7 DNX12 (GAUZE/BANDAGES/DRESSINGS) ×1 IMPLANT
DRAPE SURG 17X23 STRL (DRAPES) ×2 IMPLANT
DRSG COVADERM 4X10 (GAUZE/BANDAGES/DRESSINGS) ×2 IMPLANT
DRSG VAC ATS LRG SENSATRAC (GAUZE/BANDAGES/DRESSINGS) IMPLANT
DRSG VAC ATS MED SENSATRAC (GAUZE/BANDAGES/DRESSINGS) IMPLANT
DRSG VAC ATS SM SENSATRAC (GAUZE/BANDAGES/DRESSINGS) IMPLANT
DURAPREP 26ML APPLICATOR (WOUND CARE) ×2 IMPLANT
ELECT REM PT RETURN 9FT ADLT (ELECTROSURGICAL) ×2
ELECTRODE REM PT RTRN 9FT ADLT (ELECTROSURGICAL) ×1 IMPLANT
EXTRACTOR VACUUM M CUP 4 TUBE (SUCTIONS) IMPLANT
GLOVE BIO SURGEON STRL SZ8 (GLOVE) ×4 IMPLANT
GOWN PREVENTION PLUS LG XLONG (DISPOSABLE) ×4 IMPLANT
GOWN PREVENTION PLUS XLARGE (GOWN DISPOSABLE) ×2 IMPLANT
KIT ABG SYR 3ML LUER SLIP (SYRINGE) IMPLANT
NEEDLE HYPO 25X5/8 SAFETYGLIDE (NEEDLE) ×2 IMPLANT
NS IRRIG 1000ML POUR BTL (IV SOLUTION) ×2 IMPLANT
PACK C SECTION WH (CUSTOM PROCEDURE TRAY) ×2 IMPLANT
PAD OB MATERNITY 4.3X12.25 (PERSONAL CARE ITEMS) IMPLANT
RTRCTR C-SECT PINK 25CM LRG (MISCELLANEOUS) ×2 IMPLANT
SLEEVE SCD COMPRESS KNEE MED (MISCELLANEOUS) IMPLANT
STAPLER VISISTAT 35W (STAPLE) ×2 IMPLANT
SUT GUT PLAIN 0 CT-3 TAN 27 (SUTURE) IMPLANT
SUT MNCRL 0 VIOLET CTX 36 (SUTURE) ×3 IMPLANT
SUT MNCRL AB 4-0 PS2 18 (SUTURE) IMPLANT
SUT MON AB 2-0 CT1 27 (SUTURE) ×2 IMPLANT
SUT MON AB 3-0 SH 27 (SUTURE)
SUT MON AB 3-0 SH27 (SUTURE) IMPLANT
SUT MONOCRYL 0 CTX 36 (SUTURE) ×3
SUT PDS AB 0 CTX 60 (SUTURE) ×4 IMPLANT
SUT PLAIN 2 0 XLH (SUTURE) IMPLANT
SUT VIC AB 0 CTX 36 (SUTURE) ×2
SUT VIC AB 0 CTX36XBRD ANBCTRL (SUTURE) ×2 IMPLANT
SUT VIC AB 2-0 CT1 27 (SUTURE)
SUT VIC AB 2-0 CT1 TAPERPNT 27 (SUTURE) IMPLANT
TOWEL OR 17X24 6PK STRL BLUE (TOWEL DISPOSABLE) ×4 IMPLANT
TRAY FOLEY CATH 14FR (SET/KITS/TRAYS/PACK) ×2 IMPLANT
WATER STERILE IRR 1000ML POUR (IV SOLUTION) ×2 IMPLANT

## 2012-03-29 NOTE — Progress Notes (Signed)
Julliana Brunell is a 26 y.o. G2P1001 at [redacted]w[redacted]d by LMP admitted for active labor, rupture of membranes  Subjective:   Objective: BP 118/75  Pulse 104  Temp 98.9 F (37.2 C) (Axillary)  Resp 16  Ht 5\' 7"  (1.702 m)  Wt 113.399 kg (250 lb)  BMI 39.16 kg/m2  SpO2 100%  LMP 06/01/2011   Total I/O In: -  Out: 400 [Urine:400]  FHT:  FHR: 150-160 bpm, variability: moderate,  accelerations:  Present,  decelerations:  Present variables UC:   regular, every 2-3 minutes SVE:   Dilation: 10 Effacement (%): 100 Station: -1;0 Exam by:: E. Cone RNC  Labs: Lab Results  Component Value Date   WBC 12.2* 03/29/2012   HGB 12.5 03/29/2012   HCT 36.8 03/29/2012   MCV 93.6 03/29/2012   PLT 217 03/29/2012    Assessment / Plan: Arrest of decent.  Fu  Labor: Arrested Preeclampsia:  n/a Fetal Wellbeing:  Category II Pain Control:  Epidural I/D:  n/a Anticipated MOD:  C/S  HARPER,CHARLES A 03/29/2012, 1:41 PM

## 2012-03-29 NOTE — Transfer of Care (Signed)
Immediate Anesthesia Transfer of Care Note  Patient: Geophysical data processor  Procedure(s) Performed: Procedure(s) (LRB) with comments: CESAREAN SECTION WITH BILATERAL TUBAL LIGATION (Bilateral)  Patient Location: PACU  Anesthesia Type:Epidural  Level of Consciousness: awake, alert , oriented and patient cooperative  Airway & Oxygen Therapy: Patient Spontanous Breathing  Post-op Assessment: Report given to PACU RN and Post -op Vital signs reviewed and stable  Post vital signs: Reviewed and stable  Complications: No apparent anesthesia complications

## 2012-03-29 NOTE — Op Note (Signed)
Cesarean Section Procedure Note   Sydney Frazier   03/29/2012  Indications: Failed VBAC and Arrest of descent.   Pre-operative Diagnosis: prev c/s;failure to progress. ( Arrest of Descent )  Post-operative Diagnosis: Same   Surgeon: Coral Ceo A  Assistants: Liam Rogers  Anesthesia: epidural  Procedure Details:  The patient was seen in the Holding Room. The risks, benefits, complications, treatment options, and expected outcomes were discussed with the patient. The patient concurred with the proposed plan, giving informed consent. The patient was identified as Sydney Frazier and the procedure verified as C-Section Delivery. A Time Out was held and the above information confirmed.  After induction of anesthesia, the patient was draped and prepped in the usual sterile manner. A transverse incision was made and carried down through the subcutaneous tissue to the fascia. The fascial incision was made and extended transversely. The fascia was separated from the underlying rectus tissue superiorly and inferiorly. The peritoneum was identified and entered. The peritoneal incision was extended longitudinally. The utero-vesical peritoneal reflection was incised transversely and the bladder flap was bluntly freed from the lower uterine segment. A low transverse uterine incision was made. Delivered from cephalic presentation was a 2760 gram living newborn female infant(s). APGAR (1 MIN): 6   APGAR (5 MINS): 9   APGAR (10 MINS):    A cord ph was not sent. The umbilical cord was clamped and cut cord. A sample was obtained for evaluation. The placenta was removed Intact and appeared normal.  The uterine incision was closed with running locked sutures of 1-0 Monocryl. A second imbricating layer of the same suture was placed.  Hemostasis was observed. The paracolic gutters were irrigated. The parieto peritoneum was closed in a running fashion with 2-0 Vicryl.  The fascia was then  reapproximated with running sutures of 0 Vicryl.  The skin was closed with staples.  Instrument, sponge, and needle counts were correct prior the abdominal closure and were correct at the conclusion of the case.    Findings:  Normal uterus, ovaries and tubes   Estimated Blood Loss:  Total IV Fluids:   Urine Output: of clear urine  Specimens: Placenta   Complications: no complications  Disposition: PACU - hemodynamically stable.  Maternal Condition: stable   Baby condition / location:  nursery-stable    Signed: Surgeon(s): Brock Bad, MD Antionette Char, MD

## 2012-03-29 NOTE — MAU Note (Signed)
Pt states gush of clear fluid at 0140

## 2012-03-29 NOTE — Anesthesia Preprocedure Evaluation (Addendum)
Anesthesia Evaluation  Patient identified by MRN, date of birth, ID band Patient awake    Reviewed: Allergy & Precautions, H&P , Patient's Chart, lab work & pertinent test results  Airway Mallampati: III TM Distance: >3 FB Neck ROM: full    Dental No notable dental hx. (+) Teeth Intact   Pulmonary neg pulmonary ROS, former smoker,  breath sounds clear to auscultation  Pulmonary exam normal       Cardiovascular negative cardio ROS  Rhythm:regular Rate:Normal     Neuro/Psych negative neurological ROS  negative psych ROS   GI/Hepatic negative GI ROS, Neg liver ROS,   Endo/Other  Morbid obesity  Renal/GU negative Renal ROS  negative genitourinary   Musculoskeletal   Abdominal Normal abdominal exam  (+)   Peds  Hematology negative hematology ROS (+)   Anesthesia Other Findings   Reproductive/Obstetrics (+) Pregnancy Previous C/Section                          Anesthesia Physical Anesthesia Plan  ASA: III  Anesthesia Plan: Epidural   Post-op Pain Management:    Induction:   Airway Management Planned:   Additional Equipment:   Intra-op Plan:   Post-operative Plan:   Informed Consent: I have reviewed the patients History and Physical, chart, labs and discussed the procedure including the risks, benefits and alternatives for the proposed anesthesia with the patient or authorized representative who has indicated his/her understanding and acceptance.     Plan Discussed with: Anesthesiologist  Anesthesia Plan Comments:         Anesthesia Quick Evaluation

## 2012-03-29 NOTE — Anesthesia Procedure Notes (Signed)
Epidural Patient location during procedure: OB Start time: 03/29/2012 4:25 AM  Staffing Anesthesiologist: Esmae Donathan A. Performed by: anesthesiologist   Preanesthetic Checklist Completed: patient identified, site marked, surgical consent, pre-op evaluation, timeout performed, IV checked, risks and benefits discussed and monitors and equipment checked  Epidural Patient position: sitting Prep: site prepped and draped and DuraPrep Patient monitoring: continuous pulse ox and blood pressure Approach: midline Injection technique: LOR air  Needle:  Needle type: Tuohy  Needle gauge: 17 G Needle length: 9 cm and 9 Needle insertion depth: 7 cm Catheter type: closed end flexible Catheter size: 19 Gauge Catheter at skin depth: 12 cm Test dose: negative and Other  Assessment Events: blood not aspirated, injection not painful, no injection resistance, negative IV test and no paresthesia  Additional Notes Patient identified. Risks and benefits discussed including failed block, incomplete  Pain control, post dural puncture headache, nerve damage, paralysis, blood pressure Changes, nausea, vomiting, reactions to medications-both toxic and allergic and post Partum back pain. All questions were answered. Patient expressed understanding and wished to proceed. Sterile technique was used throughout procedure. Epidural site was Dressed with sterile barrier dressing. No paresthesias, signs of intravascular injection Or signs of intrathecal spread were encountered.  Patient was more comfortable after the epidural was dosed. Please see RN's note for documentation of vital signs and FHR which are stable.

## 2012-03-29 NOTE — H&P (Signed)
Sydney Frazier is a 26 y.o. female presenting for SROM and contractions. Maternal Medical History:  Reason for admission: Reason for admission: rupture of membranes and contractions.  Contractions: Perceived severity is strong.    Fetal activity: Perceived fetal activity is normal.    Prenatal complications: no prenatal complications   OB History    Grav Para Term Preterm Abortions TAB SAB Ect Mult Living   2 1        1      History reviewed. No pertinent past medical history. Past Surgical History  Procedure Date  . Ankle fracture surgery   . Cesarean section    Family History: family history includes Cancer in her father and Hypertension in her father. Social History:  reports that she has quit smoking. She has never used smokeless tobacco. She reports that she drinks about 2.4 ounces of alcohol per week. She reports that she does not use illicit drugs.     Review of Systems  Constitutional: Negative for fever.  Eyes: Negative for blurred vision.  Respiratory: Negative for shortness of breath.   Gastrointestinal: Negative for vomiting.  Skin: Negative for rash.  Neurological: Negative for headaches.    Dilation: 6.5 Effacement (%): 100 Station: -1 Exam by:: M.Topp,RN Blood pressure 120/82, pulse 78, temperature 98.2 F (36.8 C), temperature source Oral, resp. rate 20, height 5\' 7"  (1.702 m), weight 113.399 kg (250 lb), last menstrual period 06/01/2011, SpO2 100.00%. Maternal Exam:  Abdomen: Fetal presentation: vertex  Introitus: not evaluated.   Cervix: Cervix evaluated by digital exam.     Fetal Exam Fetal Monitor Review: Variability: moderate (6-25 bpm).   Pattern: accelerations present and no decelerations.    Fetal State Assessment: Category I - tracings are normal.     Physical Exam  Constitutional: She appears well-developed.  HENT:  Head: Normocephalic.  Neck: Neck supple. No thyromegaly present.  Cardiovascular: Normal rate and regular  rhythm.   Respiratory: Breath sounds normal.  GI: Soft. Bowel sounds are normal.  Skin: No rash noted.    Prenatal labs: ABO, Rh:   Antibody:   Rubella:   RPR:    HBsAg:    HIV:    GBS: Negative (11/11 0000)   Assessment/Plan: [place] at [redacted]w[redacted]d.  H/O previous C/D for arrest of descent.  Active labor.  Previously counseled re: prognosis for a successful VBAC. Category I FHT  Admit Epidural Pt elects to proceed w/TOLAC; monitor progress; repeat C/D for dystocia    JACKSON-MOORE,Sherry Blackard A 03/29/2012, 4:18 AM

## 2012-03-29 NOTE — Anesthesia Postprocedure Evaluation (Signed)
Anesthesia Post Note  Patient: Geophysical data processor  Procedure(s) Performed: Procedure(s) (LRB): CESAREAN SECTION WITH BILATERAL TUBAL LIGATION (Bilateral)  Anesthesia type: Epidural  Patient location: PACU  Post pain: Pain level controlled  Post assessment: Post-op Vital signs reviewed  Last Vitals:  Filed Vitals:   03/29/12 1545  BP: 132/72  Pulse: 89  Temp:   Resp: 18    Post vital signs: Reviewed  Level of consciousness: awake  Complications: No apparent anesthesia complications

## 2012-03-29 NOTE — Progress Notes (Signed)
Sydney Frazier is a 26 y.o. G2P1001 at [redacted]w[redacted]d by LMP admitted for active labor, rupture of membranes  Subjective:   Objective: BP 116/67  Pulse 101  Temp 98.1 F (36.7 C) (Oral)  Resp 16  Ht 5\' 7"  (1.702 m)  Wt 113.399 kg (250 lb)  BMI 39.16 kg/m2  SpO2 100%  LMP 06/01/2011   Total I/O In: -  Out: 400 [Urine:400]  FHT:  FHR: 150-160 bpm, variability: moderate,  accelerations:  Present,  decelerations:  Present variables. UC:   regular, every 2 minutes SVE:   Dilation: 10 Effacement (%): 100 Station: -1;0 Exam by:: E. [facility] RNC  Labs: Lab Results  Component Value Date   WBC 12.2* 03/29/2012   HGB 12.5 03/29/2012   HCT 36.8 03/29/2012   MCV 93.6 03/29/2012   PLT 217 03/29/2012    Assessment / Plan: Term.  VBAC.  No progress for 2 hours.  Probable arrest of descent.  Monitor closely.  Labor: No progress after full dilatation. Preeclampsia:  n/a Fetal Wellbeing:  Category I Pain Control:  Epidural I/D:  n/a Anticipated MOD:  C/S  Sydney Frazier A 03/29/2012, 11:06 AM

## 2012-03-30 ENCOUNTER — Encounter (HOSPITAL_COMMUNITY): Payer: Self-pay | Admitting: Obstetrics

## 2012-03-30 LAB — CBC
HCT: 31.4 % — ABNORMAL LOW (ref 36.0–46.0)
MCHC: 33.4 g/dL (ref 30.0–36.0)
MCV: 94 fL (ref 78.0–100.0)
Platelets: 156 10*3/uL (ref 150–400)
RDW: 15.2 % (ref 11.5–15.5)

## 2012-03-30 NOTE — Progress Notes (Signed)
Subjective: Postpartum Day 1: Cesarean Delivery Patient reports incisional pain and tolerating PO.    Objective: Vital signs in last 24 hours: Temp:  [97.2 F (36.2 C)-99 F (37.2 C)] 98.8 F (37.1 C) (11/12 0545) Pulse Rate:  [76-130] 120  (11/12 0545) Resp:  [15-20] 20  (11/12 0545) BP: (101-149)/(51-80) 101/51 mmHg (11/12 0545) SpO2:  [95 %-100 %] 95 % (11/12 0145)  Physical Exam:  General: alert and no distress Lochia: appropriate Uterine Fundus: firm Incision: healing well DVT Evaluation: No evidence of DVT seen on physical exam.   Basename 03/29/12 0344  HGB 12.5  HCT 36.8    Assessment/Plan: Status post Cesarean section. Doing well postoperatively.  Continue current care.  HARPER,CHARLES A 03/30/2012, 6:05 AM

## 2012-03-30 NOTE — Addendum Note (Signed)
Addendum  created 03/30/12 0814 by Shanon Payor, CRNA   Modules edited:Notes Section

## 2012-03-30 NOTE — Anesthesia Postprocedure Evaluation (Signed)
  Anesthesia Post-op Note  Patient: Sydney Frazier  Procedure(s) Performed: Procedure(s) (LRB) with comments: CESAREAN SECTION WITH BILATERAL TUBAL LIGATION (Bilateral)  Patient Location: Mother/Baby  Anesthesia Type:Epidural  Level of Consciousness: awake, alert  and oriented  Airway and Oxygen Therapy: Patient Spontanous Breathing  Post-op Pain: none  Post-op Assessment: Post-op Vital signs reviewed, Patient's Cardiovascular Status Stable, No headache, No backache, No residual numbness and No residual motor weakness  Post-op Vital Signs: Reviewed and stable  Complications: No apparent anesthesia complications

## 2012-03-31 LAB — TYPE AND SCREEN
Antibody Screen: NEGATIVE
Unit division: 0
Unit division: 0
Unit division: 0

## 2012-03-31 MED ORDER — OXYCODONE-ACETAMINOPHEN 5-325 MG PO TABS: 1.0000 | ORAL_TABLET | ORAL | Status: DC | PRN

## 2012-03-31 MED ORDER — IBUPROFEN 600 MG PO TABS: 600.0000 mg | ORAL_TABLET | Freq: Four times a day (QID) | ORAL | Status: DC

## 2012-03-31 NOTE — Discharge Summary (Signed)
Obstetric Discharge Summary Reason for Admission: onset of labor Prenatal Procedures: ultrasound Intrapartum Procedures: cesarean: low cervical, transverse Postpartum Procedures: none Complications-Operative and Postpartum: none Hemoglobin  Date Value Range Status  03/30/2012 10.5* 12.0 - 15.0 g/dL Final     HCT  Date Value Range Status  03/30/2012 31.4* 36.0 - 46.0 % Final    Physical Exam:  General: alert and no distress Lochia: appropriate Uterine Fundus: firm Incision: healing well DVT Evaluation: No evidence of DVT seen on physical exam.  Discharge Diagnoses: Term Pregnancy-delivered  Discharge Information: Date: 03/31/2012 Activity: pelvic rest Diet: routine Medications: PNV, Ibuprofen, Colace and Percocet Condition: stable Instructions: refer to practice specific booklet Discharge to: home Follow-up Information    Follow up with HARPER,CHARLES A, MD. Schedule an appointment as soon as possible for a visit in 6 weeks.   Contact information:   [address] [place] [address] [phone]          Newborn Data: Live born female  Birth Weight: 6 lb 1.4 oz (2760 g) APGAR: 6, 9  Home with mother.  HARPER,CHARLES A 03/31/2012, 8:38 AM

## 2012-03-31 NOTE — Progress Notes (Signed)
Subjective: Postpartum Day 2: Cesarean Delivery Patient reports incisional pain, tolerating PO, + flatus and no problems voiding.    Objective: Vital signs in last 24 hours: Temp:  [97.5 F (36.4 C)-98.8 F (37.1 C)] 97.5 F (36.4 C) (11/12 2210) Pulse Rate:  [102-120] 119  (11/12 2210) Resp:  [18-20] 20  (11/12 2210) BP: (101-140)/(51-75) 140/75 mmHg (11/12 2210) SpO2:  [96 %-97 %] 97 % (11/12 1400)  Physical Exam:  General: alert and no distress Lochia: appropriate Uterine Fundus: firm Incision: healing well DVT Evaluation: No evidence of DVT seen on physical exam.   Basename 03/30/12 0535 03/29/12 0344  HGB 10.5* 12.5  HCT 31.4* 36.8    Assessment/Plan: Status post Cesarean section. Doing well postoperatively.  Continue current care.  Kendryck Lacroix A 03/31/2012, 5:25 AM

## 2012-04-19 ENCOUNTER — Emergency Department (HOSPITAL_BASED_OUTPATIENT_CLINIC_OR_DEPARTMENT_OTHER)
Admission: EM | Admit: 2012-04-19 | Discharge: 2012-04-19 | Disposition: A | Discharge status: Home / Self Care | Payer: 59 | Attending: Emergency Medicine | Admitting: Emergency Medicine

## 2012-04-19 ENCOUNTER — Emergency Department (HOSPITAL_BASED_OUTPATIENT_CLINIC_OR_DEPARTMENT_OTHER): Payer: 59

## 2012-04-19 ENCOUNTER — Encounter (HOSPITAL_BASED_OUTPATIENT_CLINIC_OR_DEPARTMENT_OTHER): Payer: Self-pay | Admitting: *Deleted

## 2012-04-19 DIAGNOSIS — Z79899 Other long term (current) drug therapy: Secondary | ICD-10-CM | POA: Insufficient documentation

## 2012-04-19 DIAGNOSIS — Z87891 Personal history of nicotine dependence: Secondary | ICD-10-CM | POA: Insufficient documentation

## 2012-04-19 DIAGNOSIS — Z9889 Other specified postprocedural states: Secondary | ICD-10-CM | POA: Insufficient documentation

## 2012-04-19 DIAGNOSIS — R102 Pelvic and perineal pain: Secondary | ICD-10-CM

## 2012-04-19 DIAGNOSIS — N898 Other specified noninflammatory disorders of vagina: Secondary | ICD-10-CM | POA: Insufficient documentation

## 2012-04-19 DIAGNOSIS — N949 Unspecified condition associated with female genital organs and menstrual cycle: Secondary | ICD-10-CM | POA: Insufficient documentation

## 2012-04-19 DIAGNOSIS — Z791 Long term (current) use of non-steroidal anti-inflammatories (NSAID): Secondary | ICD-10-CM | POA: Insufficient documentation

## 2012-04-19 LAB — URINALYSIS, ROUTINE W REFLEX MICROSCOPIC
Bilirubin Urine: NEGATIVE
Hgb urine dipstick: NEGATIVE
Specific Gravity, Urine: 1.018 (ref 1.005–1.030)
pH: 5 (ref 5.0–8.0)

## 2012-04-19 LAB — URINE MICROSCOPIC-ADD ON

## 2012-04-19 LAB — WET PREP, GENITAL: Yeast Wet Prep HPF POC: NONE SEEN

## 2012-04-19 MED ORDER — OXYCODONE-ACETAMINOPHEN 5-325 MG PO TABS
2.0000 | ORAL_TABLET | Freq: Once | ORAL | Status: AC
  Administered 2012-04-19: 2 via ORAL
  Filled 2012-04-19 (×2): qty 2

## 2012-04-19 MED ORDER — OXYCODONE-ACETAMINOPHEN 5-325 MG PO TABS: 1.0000 | ORAL_TABLET | Freq: Four times a day (QID) | ORAL | Status: DC | PRN

## 2012-04-19 NOTE — ED Provider Notes (Signed)
History   This chart was scribed for Sydney B. Bernette Mayers, MD by Donne Anon, ED Scribe. This patient was seen in room MH10/MH10 and the patient's care was started at 18:28.   CSN: [id number]  Arrival date & time 04/19/12  4098   First MD Initiated Contact with Patient 04/19/12 1941      Chief Complaint  Patient presents with  . Abdominal Pain     The history is provided by the patient. No language interpreter was used.   Sydney Frazier is a 26 y.o. female who presents to the Emergency Department complaining of gradual onset, intermittent, lower right abdominal pain at the location of her cesarean section incision site from 03/29/12 that is tender to touch. She reports associated dysuria and vaginal discharge. She denies fever, draining of incision site, or vaginal bleeding. She has had 1 previous cesarean sections but no tubal ligation.  History reviewed. No pertinent past medical history.  Past Surgical History  Procedure Date  . Ankle fracture surgery   . Cesarean section   . Cesarean section with bilateral tubal ligation 03/29/2012    Procedure: CESAREAN SECTION WITH BILATERAL TUBAL LIGATION;  Surgeon: Brock Bad, MD;  Location: [facility];  Service: Obstetrics;  Laterality: Bilateral;    Family History  Problem Relation Age of Onset  . Cancer Father   . Hypertension Father     History  Substance Use Topics  . Smoking status: Former Games developer  . Smokeless tobacco: Never Used  . Alcohol Use: 2.4 oz/week    4 Cans of beer per week    OB History    Grav Para Term Preterm Abortions TAB SAB Ect Mult Living   2 2 2  0 0 0 0 0 0 2      Review of Systems A complete 10 system review of systems was obtained and all systems are negative except as noted in the HPI and PMH.   Allergies  Review of patient's allergies indicates no known allergies.  Home Medications   Current Outpatient Rx  Name  Route  Sig  Dispense  Refill  . CALCIUM CARBONATE ANTACID 500 MG PO  CHEW   Oral   Chew 1 tablet by mouth daily. For heartburn.         Marland Kitchen FOLIC ACID 800 MCG PO TABS   Oral   Take 0.5 tablets (400 mcg total) by mouth daily.   30 tablet   2   . IBUPROFEN 600 MG PO TABS   Oral   Take 1 tablet (600 mg total) by mouth every 6 (six) hours as needed for pain.   30 tablet   0   . IBUPROFEN 600 MG PO TABS   Oral   Take 1 tablet (600 mg total) by mouth every 6 (six) hours.   30 tablet   5   . OXYCODONE-ACETAMINOPHEN 5-325 MG PO TABS   Oral   Take 1-2 tablets by mouth every 4 (four) hours as needed (moderate - severe pain).   40 tablet   0   . PRENATAL 27-0.8 MG PO TABS   Oral   Take 1 tablet by mouth daily.           Triage Vitals: BP 119/79  Pulse 70  Temp 97.5 F (36.4 C) (Oral)  Resp 16  Ht 5\' 7"  (1.702 m)  Wt 215 lb (97.523 kg)  BMI 33.67 kg/m2  SpO2 100%  LMP 04/29/2011  Breastfeeding? No  Physical Exam  Nursing note and  vitals reviewed. Constitutional: She is oriented to person, place, and time. She appears well-developed and well-nourished.  HENT:  Head: Normocephalic and atraumatic.  Eyes: EOM are normal. Pupils are equal, round, and reactive to light.  Neck: Normal range of motion. Neck supple.  Cardiovascular: Normal rate, normal heart sounds and intact distal pulses.   Pulmonary/Chest: Effort normal and breath sounds normal.  Abdominal: Bowel sounds are normal. She exhibits no distension. There is tenderness. There is no guarding.       Tenderness to RLQ  Genitourinary: Cervix exhibits discharge. Cervix exhibits no motion tenderness. Right adnexum displays tenderness. Right adnexum displays no mass. Left adnexum displays no tenderness. No bleeding around the vagina. Vaginal discharge found.  Musculoskeletal: Normal range of motion. She exhibits no edema and no tenderness.  Neurological: She is alert and oriented to person, place, and time. She has normal strength. No cranial nerve deficit or sensory deficit.  Skin: Skin  is warm and dry. No rash noted.  Psychiatric: She has a normal mood and affect.    ED Course  Procedures (including critical care time) DIAGNOSTIC STUDIES: Oxygen Saturation is 100% on room air, normal by my interpretation.    COORDINATION OF CARE: 7:44 PM Discussed treatment plan which includes a pelvic exam and ultrasound with pt at bedside and pt agreed to plan.    Labs Reviewed  URINALYSIS, ROUTINE W REFLEX MICROSCOPIC - Abnormal; Notable for the following:    Leukocytes, UA TRACE (*)     All other components within normal limits  WET PREP, GENITAL - Abnormal; Notable for the following:    Clue Cells Wet Prep HPF POC FEW (*)     WBC, Wet Prep HPF POC RARE (*)     All other components within normal limits  URINE MICROSCOPIC-ADD ON  GC/CHLAMYDIA PROBE AMP   US Transvaginal Non-ob  04/19/2012  *RADIOLOGY REPORT*  Clinical Data: 3 weeks status post cesarean section delivery. Pelvic pain.  TRANSABDOMINAL AND TRANSVAGINAL ULTRASOUND OF PELVIS  Technique:  Both transabdominal and transvaginal ultrasound examinations of the pelvis were performed.  Transabdominal technique was performed for global imaging of the pelvis including uterus, ovaries, adnexal regions, and pelvic cul-de-sac.  It was necessary to proceed with endovaginal exam following the transabdominal exam to visualize the endometrium and ovaries.  Comparison:  None.  Findings: Uterus:  10.3 x 5.8 x 6.4 cm.  No fibroids identified. C-section scar noted in the anterior lower uterine segment.  Endometrium: Double layer thickness measures 12 mm.  Small amount of fluid seen in the endometrial cavity.  This is nonspecific, and endometritis cannot be excluded.  Right ovary: 2.8 x 2.0 x 2.0 cm.  Normal appearance.  Left ovary: 2.6 x 2.0 x 2.7 cm.  Normal appearance.  Other Findings:  A small amount of free fluid in right adnexa.  IMPRESSION:  1. Postpartum uterus, with small amount of nonspecific fluid noted in endometrial cavity.   Endometritis cannot be excluded. 2.  Normal ovaries.  No adnexal mass identified.   Original Report Authenticated By: Myles Rosenthal, M.D.    US Pelvis Complete  04/19/2012  *RADIOLOGY REPORT*  Clinical Data: 3 weeks status post cesarean section delivery. Pelvic pain.  TRANSABDOMINAL AND TRANSVAGINAL ULTRASOUND OF PELVIS  Technique:  Both transabdominal and transvaginal ultrasound examinations of the pelvis were performed.  Transabdominal technique was performed for global imaging of the pelvis including uterus, ovaries, adnexal regions, and pelvic cul-de-sac.  It was necessary to proceed with endovaginal exam following the transabdominal  exam to visualize the endometrium and ovaries.  Comparison:  None.  Findings: Uterus:  10.3 x 5.8 x 6.4 cm.  No fibroids identified. C-section scar noted in the anterior lower uterine segment.  Endometrium: Double layer thickness measures 12 mm.  Small amount of fluid seen in the endometrial cavity.  This is nonspecific, and endometritis cannot be excluded.  Right ovary: 2.8 x 2.0 x 2.0 cm.  Normal appearance.  Left ovary: 2.6 x 2.0 x 2.7 cm.  Normal appearance.  Other Findings:  A small amount of free fluid in right adnexa.  IMPRESSION:  1. Postpartum uterus, with small amount of nonspecific fluid noted in endometrial cavity.  Endometritis cannot be excluded. 2.  Normal ovaries.  No adnexal mass identified.   Original Report Authenticated By: Myles Rosenthal, M.D.      No diagnosis found.    MDM  Labs and imaging neg as above. Pain improved with pain meds. Advised close followup with Ob for recheck.    I personally performed the services described in this documentation, which was scribed in my presence. The recorded information has been reviewed and is accurate.        Sydney B. Bernette Mayers, MD 04/19/12 2245

## 2012-04-19 NOTE — ED Notes (Signed)
Pt c/o lower abd pain at c section wound , 11/11

## 2012-04-20 LAB — GC/CHLAMYDIA PROBE AMP: CT Probe RNA: NEGATIVE

## 2013-05-01 ENCOUNTER — Emergency Department (HOSPITAL_COMMUNITY): Payer: Medicaid Other

## 2013-05-01 ENCOUNTER — Emergency Department (HOSPITAL_BASED_OUTPATIENT_CLINIC_OR_DEPARTMENT_OTHER)
Admission: EM | Admit: 2013-05-01 | Discharge: 2013-05-01 | Disposition: A | Discharge status: Still In Hospital | Payer: Self-pay | Attending: Emergency Medicine | Admitting: Emergency Medicine

## 2013-05-01 ENCOUNTER — Encounter (HOSPITAL_BASED_OUTPATIENT_CLINIC_OR_DEPARTMENT_OTHER): Payer: Self-pay | Admitting: Emergency Medicine

## 2013-05-01 ENCOUNTER — Emergency Department (HOSPITAL_BASED_OUTPATIENT_CLINIC_OR_DEPARTMENT_OTHER): Payer: Medicaid Other

## 2013-05-01 DIAGNOSIS — W19XXXA Unspecified fall, initial encounter: Secondary | ICD-10-CM

## 2013-05-01 DIAGNOSIS — S93409A Sprain of unspecified ligament of unspecified ankle, initial encounter: Secondary | ICD-10-CM | POA: Insufficient documentation

## 2013-05-01 DIAGNOSIS — W010XXA Fall on same level from slipping, tripping and stumbling without subsequent striking against object, initial encounter: Secondary | ICD-10-CM | POA: Insufficient documentation

## 2013-05-01 DIAGNOSIS — O200 Threatened abortion: Secondary | ICD-10-CM

## 2013-05-01 DIAGNOSIS — O26899 Other specified pregnancy related conditions, unspecified trimester: Secondary | ICD-10-CM

## 2013-05-01 DIAGNOSIS — O039 Complete or unspecified spontaneous abortion without complication: Secondary | ICD-10-CM | POA: Insufficient documentation

## 2013-05-01 DIAGNOSIS — Y92009 Unspecified place in unspecified non-institutional (private) residence as the place of occurrence of the external cause: Secondary | ICD-10-CM | POA: Insufficient documentation

## 2013-05-01 DIAGNOSIS — Z87891 Personal history of nicotine dependence: Secondary | ICD-10-CM | POA: Insufficient documentation

## 2013-05-01 DIAGNOSIS — R109 Unspecified abdominal pain: Secondary | ICD-10-CM | POA: Insufficient documentation

## 2013-05-01 HISTORY — DX: Other specified health status: Z78.9

## 2013-05-01 LAB — URINE MICROSCOPIC-ADD ON

## 2013-05-01 LAB — URINALYSIS, ROUTINE W REFLEX MICROSCOPIC
Leukocytes, UA: NEGATIVE
Specific Gravity, Urine: 1.04 — ABNORMAL HIGH (ref 1.005–1.030)
Urobilinogen, UA: 1 mg/dL (ref 0.0–1.0)

## 2013-05-01 LAB — CBC
HCT: 34.3 % — ABNORMAL LOW (ref 36.0–46.0)
Hemoglobin: 11.6 g/dL — ABNORMAL LOW (ref 12.0–15.0)
MCH: 30.2 pg (ref 26.0–34.0)
MCHC: 33.8 g/dL (ref 30.0–36.0)
MCV: 89.3 fL (ref 78.0–100.0)
RBC: 3.84 MIL/uL — ABNORMAL LOW (ref 3.87–5.11)

## 2013-05-01 LAB — WET PREP, GENITAL
Trich, Wet Prep: NONE SEEN
Yeast Wet Prep HPF POC: NONE SEEN

## 2013-05-01 LAB — PREGNANCY, URINE: Preg Test, Ur: POSITIVE — AB

## 2013-05-01 MED ORDER — OXYCODONE-ACETAMINOPHEN 5-325 MG PO TABS: 2.0000 | ORAL_TABLET | ORAL | Status: AC | PRN

## 2013-05-01 MED ORDER — HYDROMORPHONE HCL PF 1 MG/ML IJ SOLN
1.0000 mg | Freq: Once | INTRAMUSCULAR | Status: AC
  Administered 2013-05-01: 1 mg via INTRAMUSCULAR
  Filled 2013-05-01: qty 1

## 2013-05-01 MED ORDER — NORETHINDRONE-ETH ESTRADIOL 1-35 MG-MCG PO TABS: 1.0000 | ORAL_TABLET | Freq: Every day | ORAL | Status: AC

## 2013-05-01 MED ORDER — KETOROLAC TROMETHAMINE 60 MG/2ML IM SOLN
60.0000 mg | Freq: Once | INTRAMUSCULAR | Status: AC
  Administered 2013-05-01: 60 mg via INTRAMUSCULAR
  Filled 2013-05-01: qty 2

## 2013-05-01 MED ORDER — PROMETHAZINE HCL 25 MG PO TABS: 25.0000 mg | ORAL_TABLET | Freq: Four times a day (QID) | ORAL | Status: AC | PRN

## 2013-05-01 NOTE — ED Notes (Addendum)
,   patient stated that the health department told her due date was July 10, no abd pain,vaginal bleeding bright red blood

## 2013-05-01 NOTE — MAU Provider Note (Signed)
History     CSN: [id number]  Arrival date and time: 05/01/13 1450   First Provider Initiated Contact with Patient 05/01/13 1800      Chief Complaint  Patient presents with  . Vaginal Bleeding  . Abdominal Pain   HPI 27 y.o. [id number] at [redacted]w[redacted]d with vaginal bleeding and cramping. States she has been spotting for the past week, fell today and bleeding became heavy afterwards. Was [facility] at [facility] for [facility] pain after fall, sent here for further eval d/t vaginal bleeding. States she has been passing large clots and cramping has worsened since arrival to MAU.   Past Medical History  Diagnosis Date  . Medical history non-contributory     Past Surgical History  Procedure Laterality Date  . Ankle fracture surgery    . Cesarean section    . Cesarean section with bilateral tubal ligation  03/29/2012    Per pt, was not allowed to have tubal  Surgeon: Brock Bad, MD;  Location: [facility];  Service: Obstetrics;  Laterality: Bilateral;  . Wisdom tooth extraction      Family History  Problem Relation Age of Onset  . Cancer Father   . Hypertension Father     History  Substance Use Topics  . Smoking status: Former Games developer  . Smokeless tobacco: Never Used  . Alcohol Use: No    Allergies: No Known Allergies  Prescriptions prior to admission  Medication Sig Dispense Refill  . Pediatric Multiple Vit-C-FA (FLINSTONES GUMMIES OMEGA-3 DHA) CHEW Chew 1 tablet by mouth daily.        Review of Systems  Constitutional: Negative.   Respiratory: Negative.   Cardiovascular: Negative.   Gastrointestinal: Negative for nausea, vomiting, abdominal pain, diarrhea and constipation.  Genitourinary: Negative for dysuria, urgency, frequency, hematuria and flank pain.       Positive for vaginal bleeding, cramping  Musculoskeletal: Positive for falls and joint pain.  Neurological: Negative.   Psychiatric/Behavioral: Negative.    Physical Exam   Blood pressure 114/78, pulse 100,  temperature 97.9 F (36.6 C), temperature source Oral, resp. rate 16, height 5' 5.5" (1.664 m), weight 96.616 kg (213 lb), last menstrual period 02/18/2013, SpO2 99.00%.  Physical Exam  Nursing note and vitals reviewed. Constitutional: She is oriented to person, place, and time. She appears well-developed and well-nourished. She appears distressed (uncomfortable appearing).  Cardiovascular: Normal rate.   Respiratory: Effort normal.  GI: Soft. There is tenderness (low abd).  Genitourinary: Uterus is tender. Right adnexum displays tenderness. Left adnexum displays tenderness. There is bleeding (moderate bleeding and clots) around the vagina.  Cervix open to about 1 cm externally, unable to reach through internal os  Musculoskeletal: Normal range of motion.  Neurological: She is alert and oriented to person, place, and time.  Skin: Skin is warm and dry.  Psychiatric: She has a normal mood and affect.    MAU Course  Procedures Results for orders placed during the hospital encounter of 05/01/13 (from the past 24 hour(s))  URINALYSIS, ROUTINE W REFLEX MICROSCOPIC     Status: Abnormal   Collection Time    05/01/13  3:21 PM      Result Value Range   Color, Urine Keyri (*) YELLOW   APPearance CLOUDY (*) CLEAR   Specific Gravity, Urine 1.040 (*) 1.005 - 1.030   pH 6.0  5.0 - 8.0   Glucose, UA NEGATIVE  NEGATIVE mg/dL   Hgb urine dipstick LARGE (*) NEGATIVE   Bilirubin Urine NEGATIVE  NEGATIVE  Ketones, ur NEGATIVE  NEGATIVE mg/dL   Protein, ur >161 (*) NEGATIVE mg/dL   Urobilinogen, UA 1.0  0.0 - 1.0 mg/dL   Nitrite NEGATIVE  NEGATIVE   Leukocytes, UA NEGATIVE  NEGATIVE  PREGNANCY, URINE     Status: Abnormal   Collection Time    05/01/13  3:21 PM      Result Value Range   Preg Test, Ur POSITIVE (*) NEGATIVE  URINE MICROSCOPIC-ADD ON     Status: Abnormal   Collection Time    05/01/13  3:21 PM      Result Value Range   WBC, UA 3-6  <3 WBC/hpf   RBC / HPF TOO NUMEROUS TO COUNT  <3  RBC/hpf   Bacteria, UA FEW (*) RARE   Urine-Other URINALYSIS PERFORMED ON SUPERNATANT    WET PREP, GENITAL     Status: Abnormal   Collection Time    05/01/13  5:55 PM      Result Value Range   Yeast Wet Prep HPF POC NONE SEEN  NONE SEEN   Trich, Wet Prep NONE SEEN  NONE SEEN   Clue Cells Wet Prep HPF POC FEW (*) NONE SEEN   WBC, Wet Prep HPF POC FEW (*) NONE SEEN  CBC     Status: Abnormal   Collection Time    05/01/13  6:30 PM      Result Value Range   WBC 11.0 (*) 4.0 - 10.5 K/uL   RBC 3.84 (*) 3.87 - 5.11 MIL/uL   Hemoglobin 11.6 (*) 12.0 - 15.0 g/dL   HCT 09.6 (*) 04.5 - 40.9 %   MCV 89.3  78.0 - 100.0 fL   MCH 30.2  26.0 - 34.0 pg   MCHC 33.8  30.0 - 36.0 g/dL   RDW 81.1  91.4 - 78.2 %   Platelets 274  150 - 400 K/uL   Dg Ankle Complete Right  05/01/2013   CLINICAL DATA:  History of fall  EXAM: RIGHT ANKLE - COMPLETE 3+ VIEW  COMPARISON:  None.  FINDINGS: There is no evidence of fracture, dislocation, or joint effusion. There is no evidence of arthropathy or other focal bone abnormality. Soft tissues are unremarkable. Patient is status post open reduction internal fixation distal fibular fracture. The hardware appears intact without evidence of loosening or failure.  IMPRESSION: Negative.   Electronically Signed   By: Salome Holmes M.D.   On: 05/01/2013 15:56   US Ob Comp Less 14 Wks  05/01/2013   CLINICAL DATA:  Fall. Vaginal bleeding. Quantitative beta HCG unknown. Patient is reportedly [redacted] weeks pregnant.  EXAM: OBSTETRIC <14 WK [place] AND TRANSVAGINAL OB US  TECHNIQUE: Both transabdominal and transvaginal ultrasound examinations were performed for complete evaluation of the gestation as well as the maternal uterus, adnexal regions, and pelvic cul-de-sac. Transvaginal technique was performed to assess early pregnancy.  COMPARISON:  None.  FINDINGS: Intrauterine gestational sac: None present.  Yolk sac:  None.  Embryo:  None.  Cardiac Activity: Not applicable.  Maternal  uterus/adnexae: Trace free fluid is present in the anatomic pelvis. The ovaries appear normal.  IMPRESSION: No intrauterine pregnancy is identified. Trace physiologic free fluid.   Electronically Signed   By: Andreas Newport M.D.   On: 05/01/2013 19:39   US Ob Transvaginal  05/01/2013   CLINICAL DATA:  Fall. Vaginal bleeding. Quantitative beta HCG unknown. Patient is reportedly [redacted] weeks pregnant.  EXAM: OBSTETRIC <14 WK [place] AND TRANSVAGINAL OB US  TECHNIQUE: Both transabdominal and transvaginal ultrasound  examinations were performed for complete evaluation of the gestation as well as the maternal uterus, adnexal regions, and pelvic cul-de-sac. Transvaginal technique was performed to assess early pregnancy.  COMPARISON:  None.  FINDINGS: Intrauterine gestational sac: None present.  Yolk sac:  None.  Embryo:  None.  Cardiac Activity: Not applicable.  Maternal uterus/adnexae: Trace free fluid is present in the anatomic pelvis. The ovaries appear normal.  IMPRESSION: No intrauterine pregnancy is identified. Trace physiologic free fluid.   Electronically Signed   By: Andreas Newport M.D.   On: 05/01/2013 19:39   Dg Foot Complete Right  05/01/2013   CLINICAL DATA:  History of fall  EXAM: RIGHT FOOT COMPLETE - 3+ VIEW  COMPARISON:  None.  FINDINGS: There is no evidence of fracture or dislocation. There is no evidence of arthropathy or other focal bone abnormality. Soft tissues are unremarkable.  IMPRESSION: Negative.   Electronically Signed   By: Salome Holmes M.D.   On: 05/01/2013 15:57     Assessment and Plan  Care assumed by Jannifer Rodney, NP  A: Complete Miscarriage Sprained ankle Contraception  P: Pain management today to include Toradol, Dilaudid in MAU. To take home Percocert/ phenergan Note to be out of work this week  [facility] BCP one po qday # 28/ 3 refills/ do not start until quants have fallen. You will be advised by phone Return to MAU for follow up quant in 2 days   Carolynn Serve 05/01/2013, 7:59 PM

## 2013-05-01 NOTE — ED Provider Notes (Signed)
CSN: [id number]     Arrival date & time 05/01/13  1450 History  This chart was scribed for Charles B. Bernette Mayers, MD by Dorothey Baseman, ED Scribe. This patient was seen in room MH01/MH01 and the patient's care was started at 3:22 PM.    Chief Complaint  Patient presents with  . Fall   The history is provided by the patient. No language interpreter was used.   HPI Comments: Sydney Frazier is a 27 y.o. female who presents to the Emergency Department complaining of a fall that occurred about 2 hours ago. She reports an associated, constant pain to the right ankle that is exacerbated with bearing weight and walking. Patient also reports that 2-3 weeks ago she found out she is currently about 9-[redacted] weeks pregnant (estimated based on her last menstrual period). Patient reports that she fell onto the concrete and landed on her abdomen. She reports that she has been spotting light, pink blood for the past few days, but that the bleeding became progressively heavier and she began passing clots after the fall. Patient reports that this his her 3rd pregnancy, both deliveries were via cesarean section. Patient has no other pertinent medical history.   History reviewed. No pertinent past medical history. Past Surgical History  Procedure Laterality Date  . Ankle fracture surgery    . Cesarean section    . Cesarean section with bilateral tubal ligation  03/29/2012    Procedure: CESAREAN SECTION WITH BILATERAL TUBAL LIGATION;  Surgeon: Brock Bad, MD;  Location: [facility];  Service: Obstetrics;  Laterality: Bilateral;   Family History  Problem Relation Age of Onset  . Cancer Father   . Hypertension Father    History  Substance Use Topics  . Smoking status: Former Games developer  . Smokeless tobacco: Never Used  . Alcohol Use: 2.4 oz/week    4 Cans of beer per week   OB History   Grav Para Term Preterm Abortions TAB SAB Ect Mult Living   3 2 2  0 0 0 0 0 0 2     Review of Systems  A complete 10  system review of systems was obtained and all systems are negative except as noted in the HPI and PMH.   Allergies  Review of patient's allergies indicates no known allergies.  Home Medications   Current Outpatient Rx  Name  Route  Sig  Dispense  Refill  . calcium carbonate (TUMS - DOSED IN MG ELEMENTAL CALCIUM) 500 MG chewable tablet   Oral   Chew 1 tablet by mouth daily. For heartburn.         Marland Kitchen EXPIRED: ibuprofen (ADVIL,MOTRIN) 600 MG tablet   Oral   Take 1 tablet (600 mg total) by mouth every 6 (six) hours as needed for pain.   30 tablet   0   . ibuprofen (ADVIL,MOTRIN) 600 MG tablet   Oral   Take 1 tablet (600 mg total) by mouth every 6 (six) hours.   30 tablet   5   . oxyCODONE-acetaminophen (PERCOCET/ROXICET) 5-325 MG per tablet   Oral   Take 1-2 tablets by mouth every 4 (four) hours as needed (moderate - severe pain).   40 tablet   0   . oxyCODONE-acetaminophen (PERCOCET/ROXICET) 5-325 MG per tablet   Oral   Take 1-2 tablets by mouth every 6 (six) hours as needed for pain.   20 tablet   0   . Prenatal Vit-Fe Fumarate-FA (MULTIVITAMIN-PRENATAL) 27-0.8 MG TABS   Oral  Take 1 tablet by mouth daily.          Triage Vitals: BP 125/76  Pulse 64  Temp(Src) 97.4 F (36.3 C) (Oral)  Resp 16  Ht 5\' 7"  (1.702 m)  Wt 200 lb (90.719 kg)  BMI 31.32 kg/m2  SpO2 100%  LMP 02/18/2013  Physical Exam  Nursing note and vitals reviewed. Constitutional: She is oriented to person, place, and time. She appears well-developed and well-nourished.  HENT:  Head: Normocephalic and atraumatic.  Eyes: EOM are normal. Pupils are equal, round, and reactive to light.  Neck: Normal range of motion. Neck supple.  Cardiovascular: Normal rate, normal heart sounds and intact distal pulses.   Pulmonary/Chest: Effort normal and breath sounds normal.  Abdominal: Soft. Bowel sounds are normal. She exhibits no distension. There is no tenderness.  Mild tenderness to palpation to the  suprapubic region.   Musculoskeletal: Normal range of motion. She exhibits tenderness. She exhibits no edema.  Tenderness to the medial right malleolus and at the base of the right, 5th metatarsal.   Neurological: She is alert and oriented to person, place, and time. She has normal strength. No cranial nerve deficit or sensory deficit.  Skin: Skin is warm and dry. No rash noted.  Psychiatric: She has a normal mood and affect.    ED Course  Procedures (including critical care time)  DIAGNOSTIC STUDIES: Oxygen Saturation is 100% on room air, normal by my interpretation.    COORDINATION OF CARE: 3:25 PM- Will order an x-ray of the right ankle and right foot. Ordered UA. Advised patient to follow up with the [facility] today to receive an ultrasound. Discussed treatment plan with patient at bedside and patient verbalized agreement.     Labs Review Labs Reviewed  URINALYSIS, ROUTINE W REFLEX MICROSCOPIC - Abnormal; Notable for the following:    Color, Urine Saraann (*)    APPearance CLOUDY (*)    Specific Gravity, Urine 1.040 (*)    Hgb urine dipstick LARGE (*)    Protein, ur >300 (*)    All other components within normal limits  PREGNANCY, URINE - Abnormal; Notable for the following:    Preg Test, Ur POSITIVE (*)    All other components within normal limits  URINE MICROSCOPIC-ADD ON - Abnormal; Notable for the following:    Bacteria, UA FEW (*)    All other components within normal limits  URINE CULTURE   Imaging Review Dg Ankle Complete Right  05/01/2013   CLINICAL DATA:  History of fall  EXAM: RIGHT ANKLE - COMPLETE 3+ VIEW  COMPARISON:  None.  FINDINGS: There is no evidence of fracture, dislocation, or joint effusion. There is no evidence of arthropathy or other focal bone abnormality. Soft tissues are unremarkable. Patient is status post open reduction internal fixation distal fibular fracture. The hardware appears intact without evidence of loosening or failure.   IMPRESSION: Negative.   Electronically Signed   By: Salome Holmes M.D.   On: 05/01/2013 15:56   Dg Foot Complete Right  05/01/2013   CLINICAL DATA:  History of fall  EXAM: RIGHT FOOT COMPLETE - 3+ VIEW  COMPARISON:  None.  FINDINGS: There is no evidence of fracture or dislocation. There is no evidence of arthropathy or other focal bone abnormality. Soft tissues are unremarkable.  IMPRESSION: Negative.   Electronically Signed   By: Salome Holmes M.D.   On: 05/01/2013 15:57    EKG Interpretation   None       MDM  1. Abdominal pain in pregnancy   2. Threatened miscarriage in early pregnancy   3. Ankle sprain and strain, right, initial encounter   4. Fall, initial encounter     Pt with fall, neg ankle xrays. She is early pregnancy has had pink spotting now with worsening bleeding and lower abdominal cramping. [place] not available here at this time. Pelvic exam deferred to MAU. Discussed with Dr. Emelda Fear who will accept in transfer to MAU for further eval. Pt to go by POV.   I personally performed the services described in this documentation, which was scribed in my presence. The recorded information has been reviewed and is accurate.       Charles B. Bernette Mayers, MD 05/01/13 [phone]

## 2013-05-01 NOTE — ED Notes (Addendum)
Pt reports fall from standing landing of abd. 9 weeks preg , pt c/o right ankle pain and vaginal bleeding

## 2013-05-01 NOTE — ED Notes (Signed)
I placed the patient's right ankle into an ASO. No place to charge for the ASO on the charge capture.

## 2013-05-01 NOTE — MAU Note (Signed)
Pt states was playing with her children and tripped and fell around 1230 this afternoon. Had pink spotting two days ago. Roughly fifteen minutes after falling began having heavy bleeding. Pt applied new pad after being triaged. In room, RN noted small amount of BRB on pad. Pt states she passed a ?grapefruit sized clot in toilet at home.

## 2013-05-01 NOTE — MAU Note (Signed)
Patient was seen at [facility] today for a fall and vaginal bleeding. Patient states she has had spotting for several days but after the fall on her abdomen it became heavier and went from pink to red. Now having bad abdominal cramping.

## 2013-05-02 LAB — GC/CHLAMYDIA PROBE AMP: CT Probe RNA: NEGATIVE

## 2013-05-02 LAB — URINE CULTURE

## 2013-06-04 NOTE — MAU Provider Note (Signed)
Attestation of Attending Supervision of Advanced Practitioner (PA/CNM/NP): Evaluation and management procedures were performed by the Advanced Practitioner under my supervision and collaboration.  I have reviewed the Advanced Practitioner's note and chart, and I agree with the management and plan.  Lynnwood Beckford, MD, FACOG Attending Obstetrician & Gynecologist Faculty Practice, [facility]  

## 2013-11-10 IMAGING — US US OB COMP LESS 14 WK
1 series · 14 of 28 positions shown · non-contrast
Comparison: None

CLINICAL DATA: Pregnant, left lower quadrant pain, uncertain LMP;
no quantitative beta HCG for correlation

OBSTETRIC <14 WK US AND TRANSVAGINAL OB US
TECHNIQUE: Both transabdominal and transvaginal ultrasound
examinations were performed for complete evaluation of the
gestation as well as the maternal uterus, adnexal regions, and
pelvic cul-de-sac.  Transvaginal technique was performed to assess
early pregnancy.

[Series 1: us ob comp less 14 wk · 0.32mm/px · 14 of 76 slices shown]
[im 3/76]
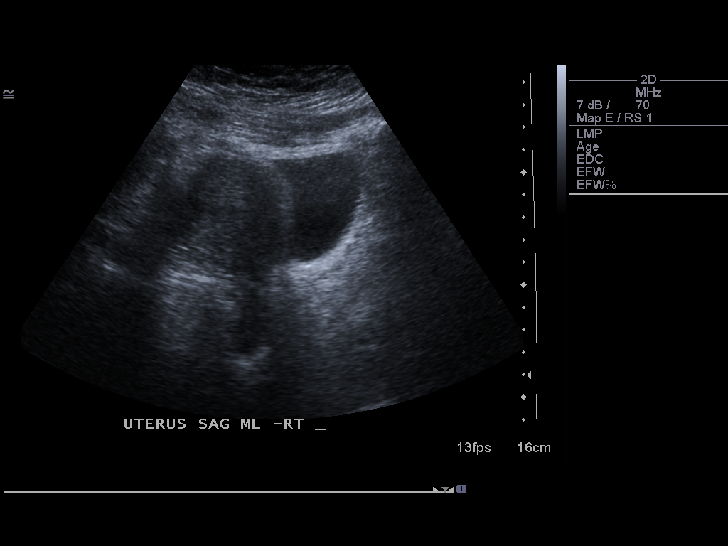
[im 9/76]
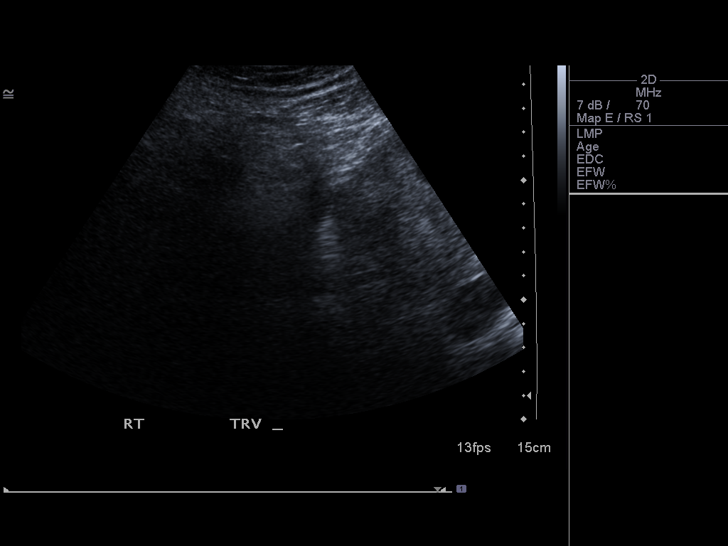
[im 14/76]
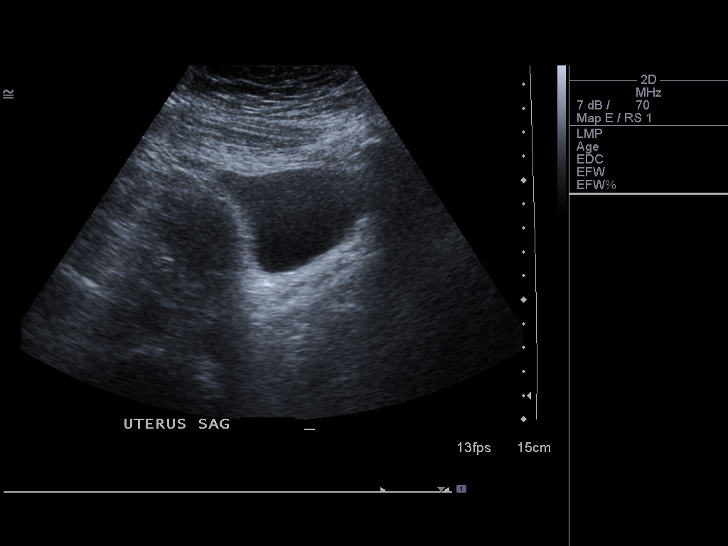
[im 20/76]
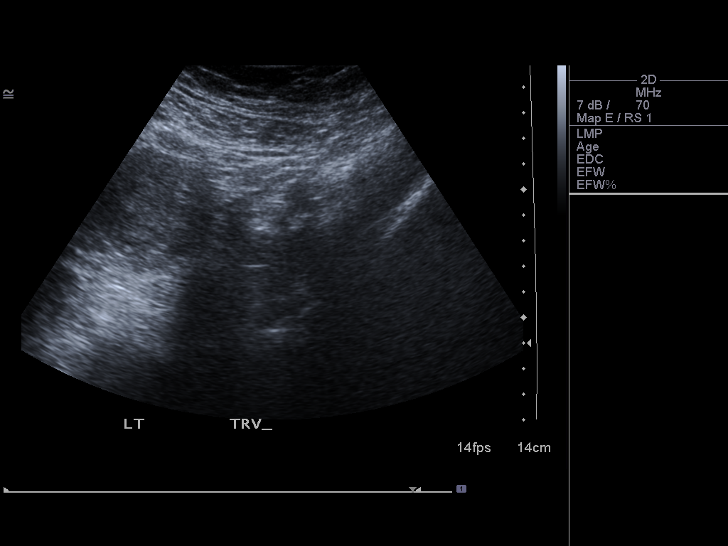
[im 26/76]
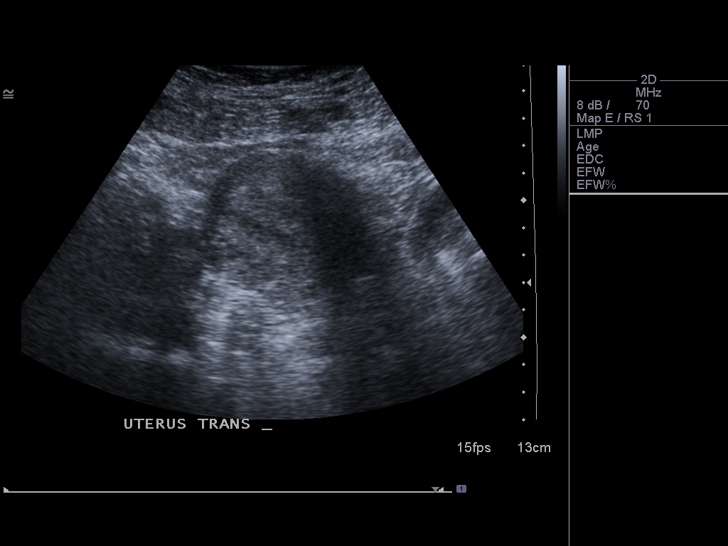
[im 31/76]
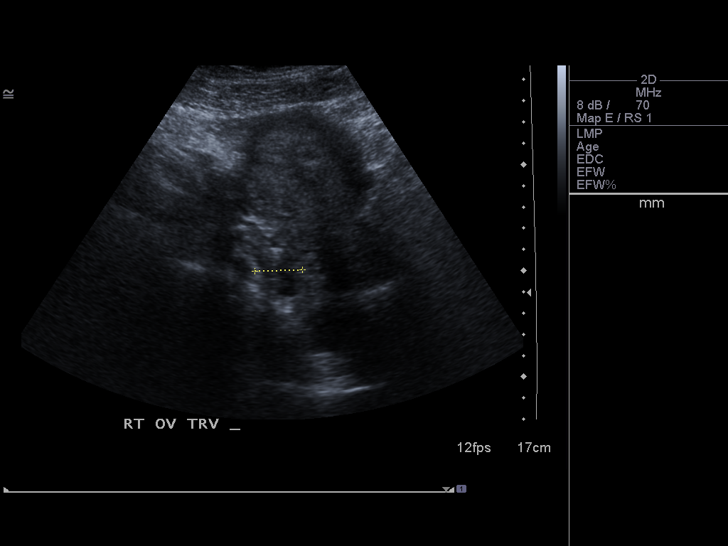
[im 37/76]
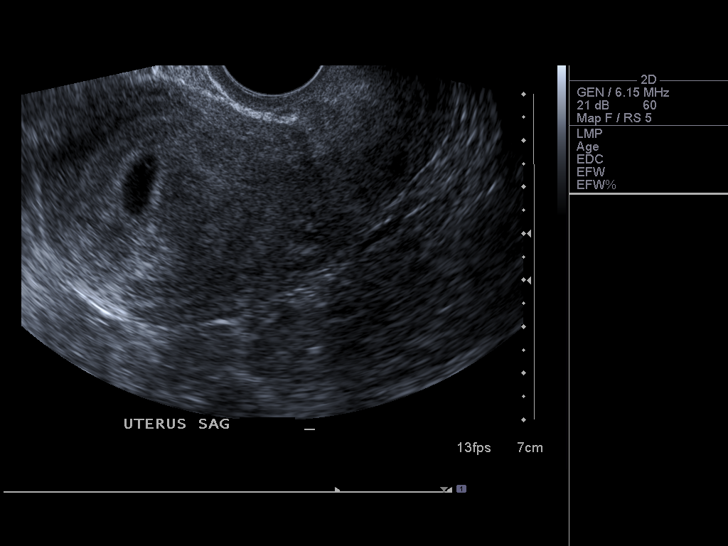
[im 42/76]
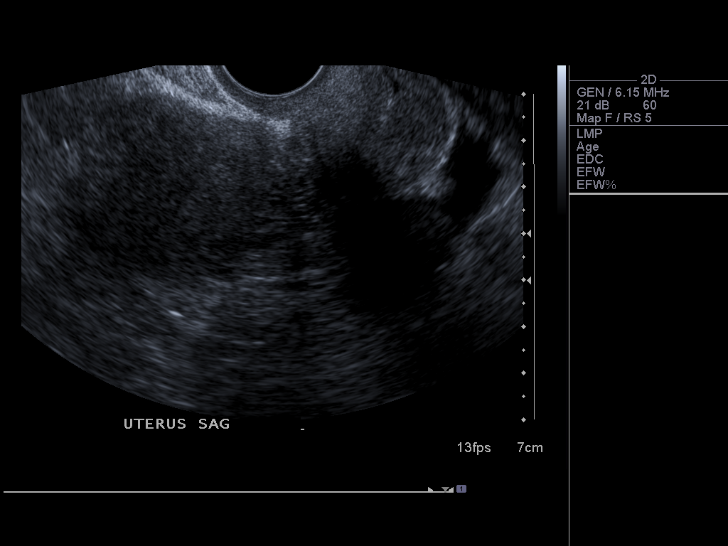
[im 48/76]
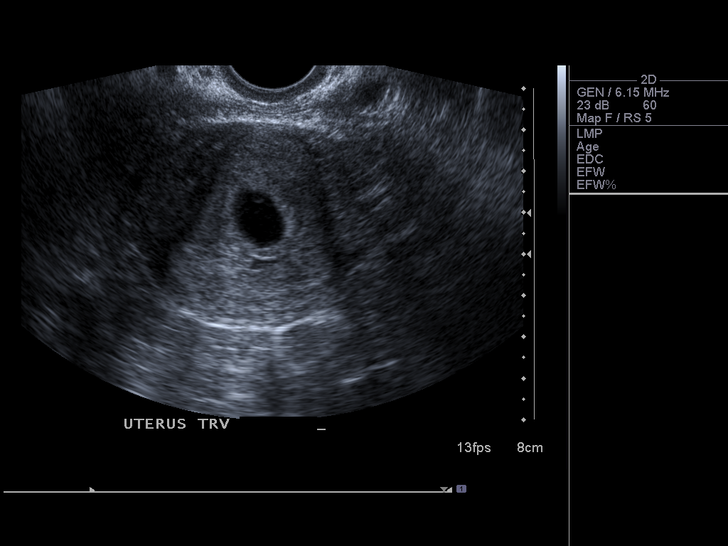
[im 53/76]
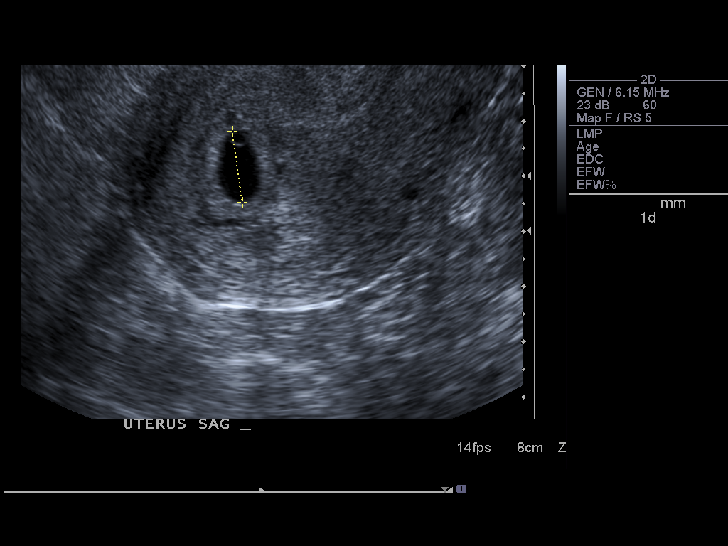
[im 59/76]
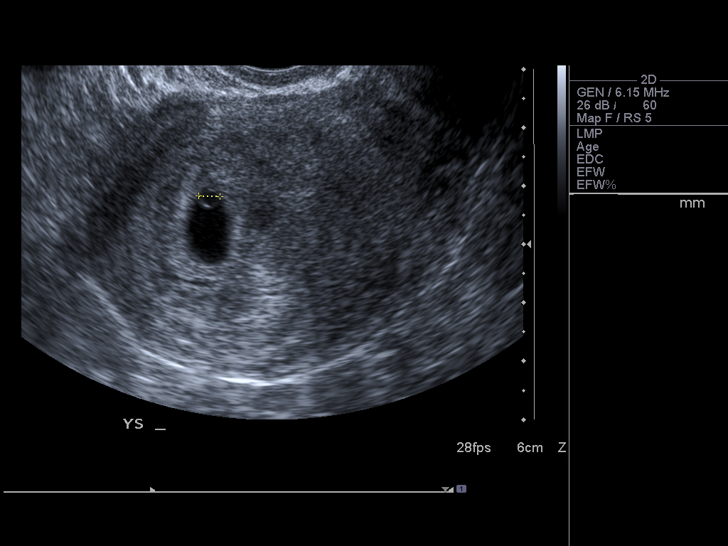
[im 64/76]
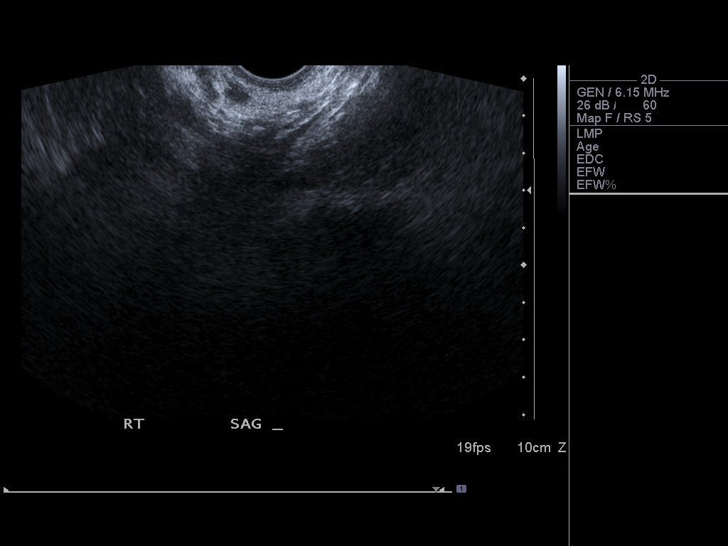
[im 70/76]
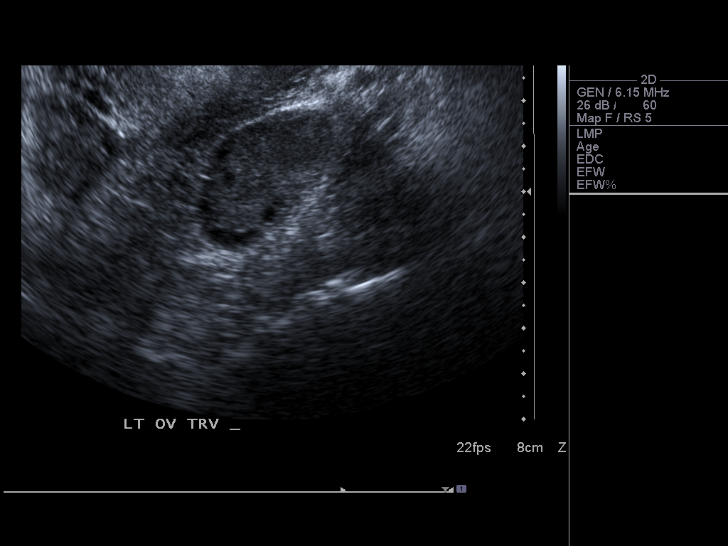
[im 76/76]
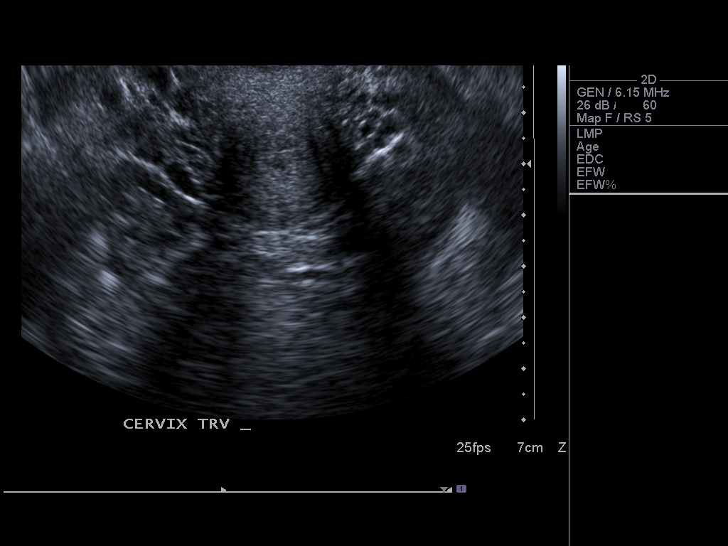

[14 of 28 positions shown; findings below may reference images not displayed]

Intrauterine gestational sac:  Visualized/normal in shape.
Yolk sac: Present
Embryo: Not identified
Cardiac Activity: N/A
Heart Rate: N/A bpm

MSD: 10.5 mm,   5  w   6  d         US EDC: 04/13/2012

Maternal uterus/adnexae:
No subchorionic hemorrhage.
Right ovary normal size and morphology, 2.2 x 2.2 x 4.0 cm.
Left ovary normal size and morphology, 2.7 x 4.3 x 2.4 cm.
No free pelvic fluid or adnexal masses.
IMPRESSION: Early intrauterine gestation measured at 5 weeks 6 days EGA by mean
sac diameter.
A yolk sac is identified to confirm intrauterine pregnancy though
no fetal pole is yet identified to assess viability.
Consider follow-up ultrasound in 10-14 days to assess fetal
viability, if clinically indicated.

## 2014-02-03 IMAGING — US US OB DETAIL+14 WK
1 series · 12 of 28 positions shown · non-contrast
Comparison: none

[Series 1: us ob detail +14 wk · 12 of 96 slices shown]
[im 4/96]
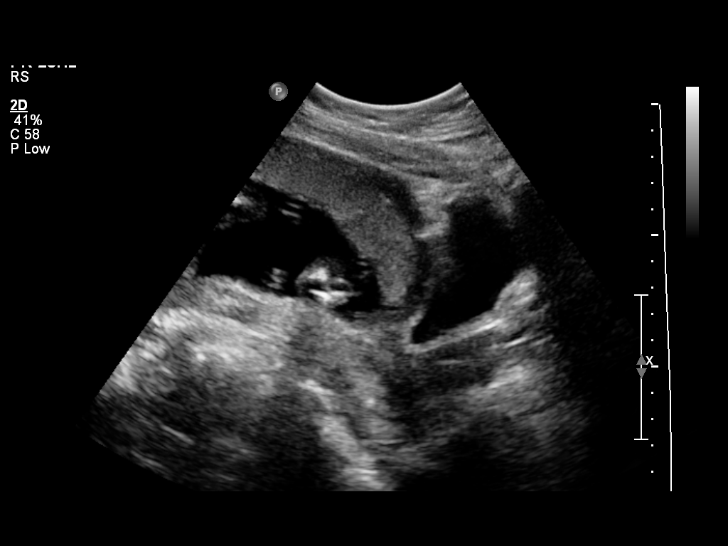
[im 11/96]
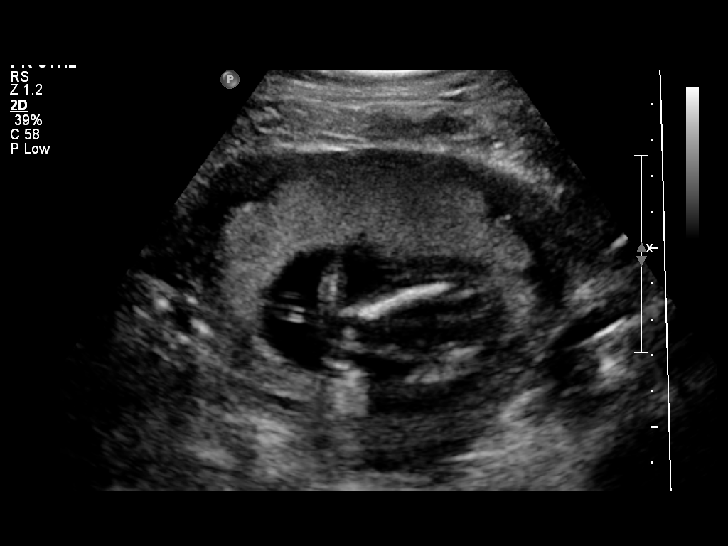
[im 18/96]
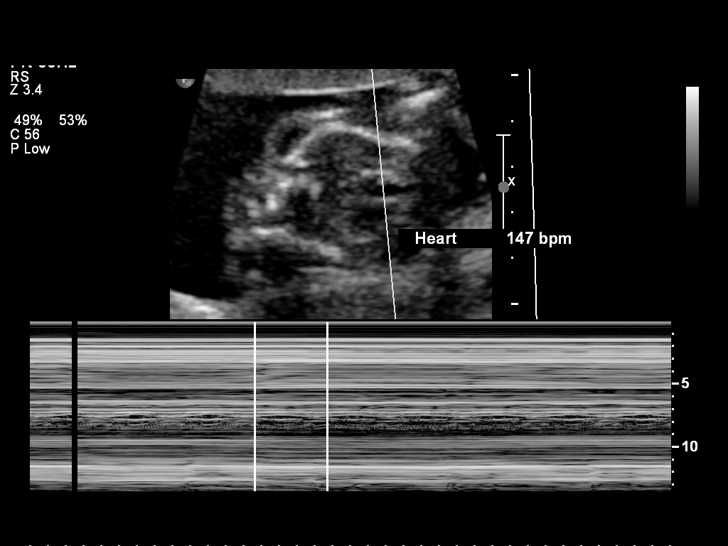
[im 29/96]
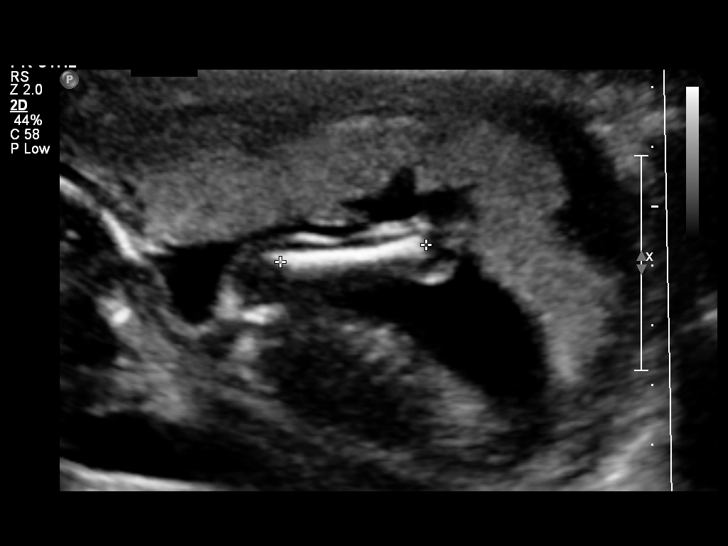
[im 36/96]
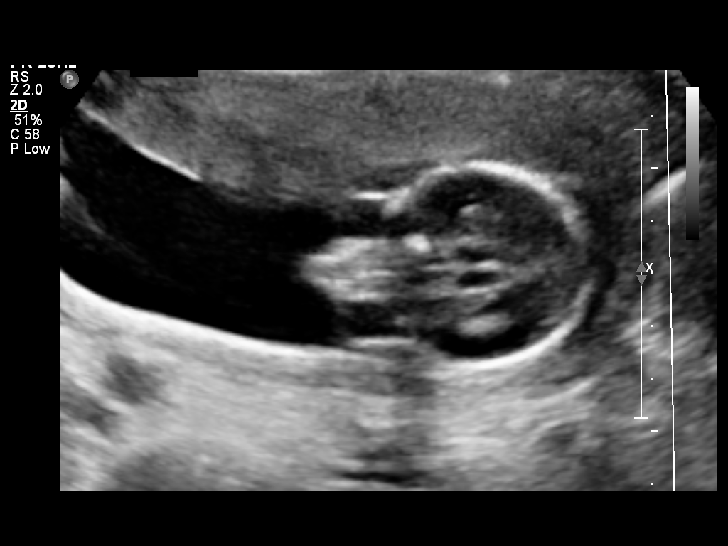
[im 43/96]
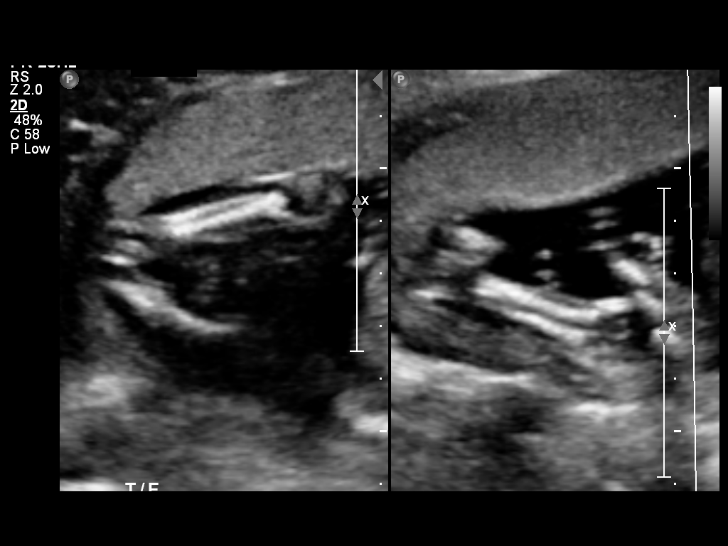
[im 53/96]
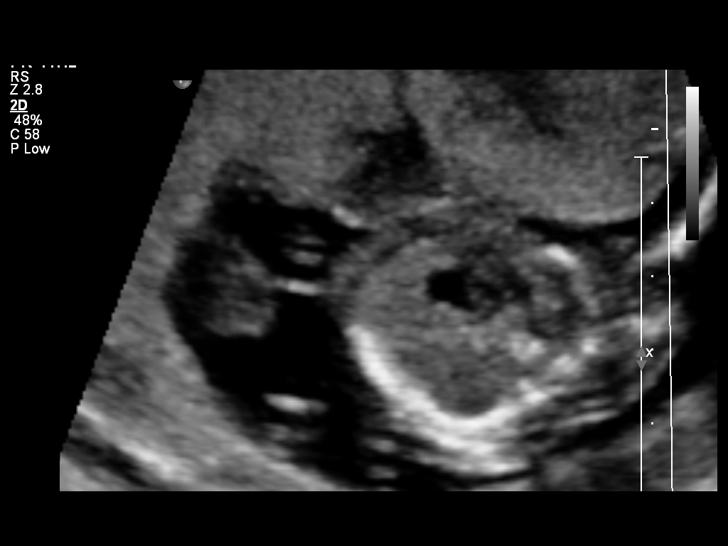
[im 60/96]
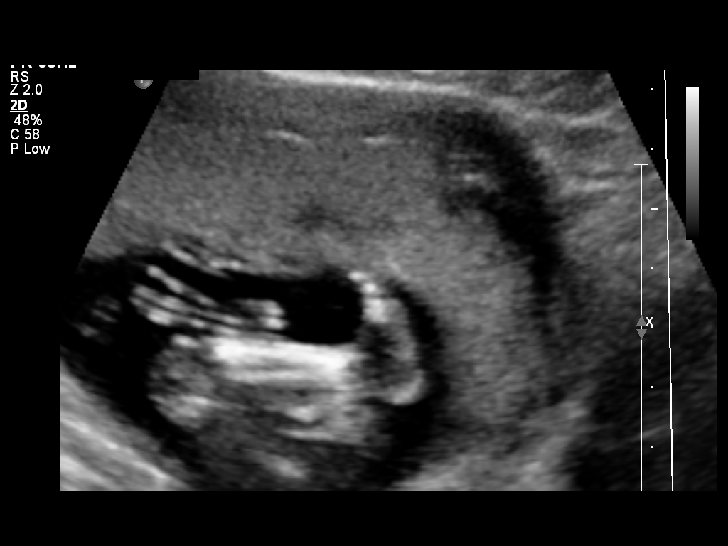
[im 67/96]
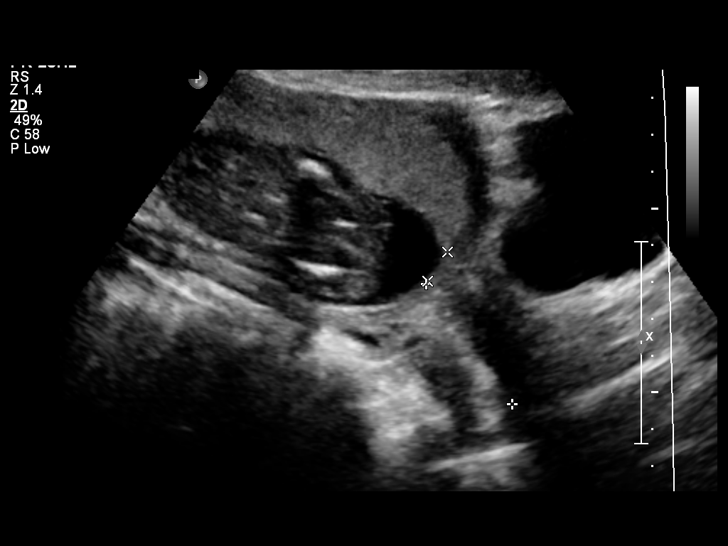
[im 78/96]
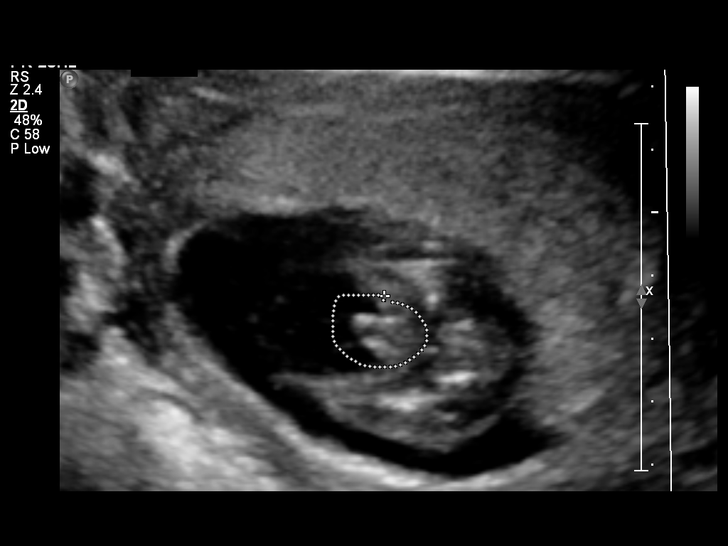
[im 85/96]
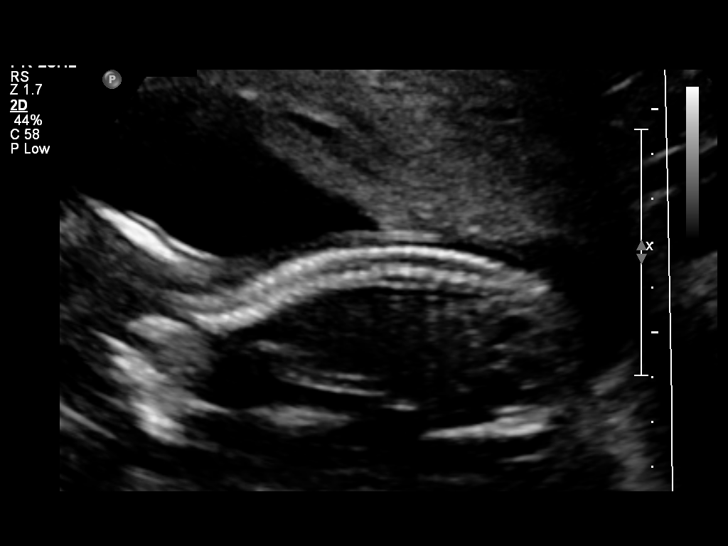
[im 92/96]
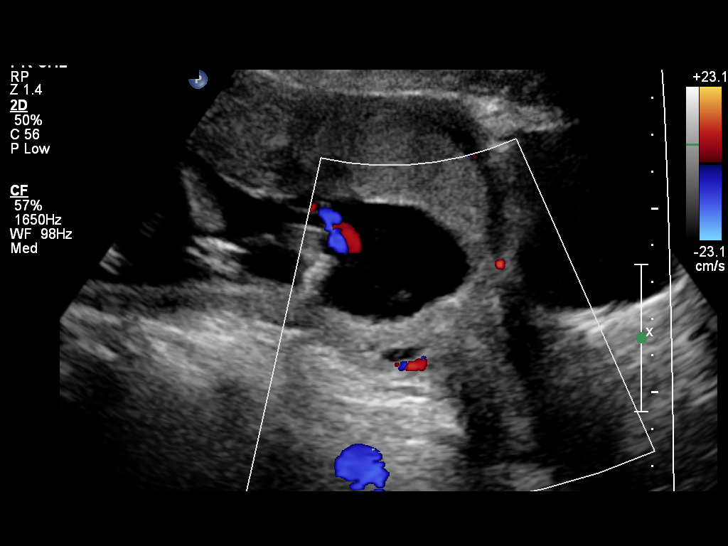

[12 of 28 positions shown; findings below may reference images not displayed]

OBSTETRICS REPORT
                      (Signed Final 11/11/2011 [DATE])

          JIM

 Order#:         [id number]
Procedures

 US OB DETAIL + 14 WK                                  76811.0
Indications

 Detailed fetal anatomic survey
 Unsure of LMP;  Establish Gestational [AGE]
 Previous cesarean section
Fetal Evaluation

 Preg. Location:    Intrauterine
 Fetal Heart Rate:  147                         bpm
 Cardiac Activity:  Observed
 Presentation:      Oblique, Breech
 Placenta:          Anterior, low-lying, 1 cm
                    from int os
 P. Cord            Visualized
 Insertion:

 Amniotic Fluid
 AFI FV:      Subjectively within normal limits
                                             Larg Pckt:   4.03   cm
 LUQ:   4.03   cm
Biometry

 BPD:       40  mm    G. Age:   18w 1d                CI:         70.8   70 - 86
                                                      FL/HC:      16.5   15.8 -
                                                                         18
 HC:     151.5  mm    G. Age:   18w 1d       51  %    HC/AC:      1.18   1.07 -

 AC:     128.4  mm    G. Age:   18w 3d       61  %    FL/BPD:
 FL:        25  mm    G. Age:   17w 4d       29  %    FL/AC:      19.5   20 - 24
 HUM:     24.7  mm    G. Age:   17w 5d       47  %
 CER:     18.2  mm    G. Age:   18w 1d       53  %
 NFT:     3.38  mm

 Est. FW:     220  gm      0 lb 8 oz     50  %
Gestational Age

 LMP:           23w 2d       Date:   06/01/11                 EDD:   03/07/12
 U/S Today:     18w 0d                                        EDD:   04/13/12
 Best:          18w 0d    Det. By:   U/S (11/11/11)           EDD:   04/13/12
Anatomy

 Cranium:           Appears normal      Aortic Arch:       Appears normal
 Fetal Cavum:       Appears normal      Ductal Arch:       Appears normal
 Ventricles:        Appears normal      Diaphragm:         Appears normal
 Choroid Plexus:    Appears normal      Stomach:           Appears
                                                           normal, left
                                                           sided
 Cerebellum:        Appears normal      Abdomen:           Appears normal
 Posterior Fossa:   Appears normal      Abdominal Wall:    Appears nml
                                                           (cord insert,
                                                           abd wall)
 Nuchal Fold:       Appears normal      Cord Vessels:      Appears normal
                    (neck, nuchal                          (3 vessel cord)
                    fold)
 Face:              Orbits appear       Kidneys:           Appear normal
                    normal
 Heart:             Not well            Bladder:           Appears normal
                    visualized
 RVOT:              Appears normal      Spine:             Appears normal
 LVOT:              Appears normal      Limbs:             Four extremities
                                                           seen

 Other:     Fetus appears to be a male. Heels visualized.
            Technically difficult due to  maternal habitus.
Targeted Anatomy

 Fetal Central Nervous System
 Lat. Ventricles:   6.1                 Cisterna Magna:
Cervix Uterus Adnexa

 Cervical Length:   4         cm

 Cervix:       Normal appearance by transabdominal scan.
 Uterus:       No abnormality visualized.
 Cul De Sac:   No free fluid seen.
 Left Ovary:   Within normal limits.
 Right Ovary:  Within normal limits.

 Adnexa:     No abnormality visualized.
Impression

 Single living intrauterine gestation with normal visualized
 anatomy. The fetal heart is incompletely seen today, so follow
 up ultrasound in 2 weeks is recommended. Because of
 incomplete anatomy scan today, modified risk for Trisomy 21
 can be calculated when the patient returns.

 Today's sonographic EGA is 5 weeks younger than estimated
 by LMP.  The fetal indices are concordant.

 JIM with us.  Please do not hesitate to

## 2014-03-06 ENCOUNTER — Encounter (HOSPITAL_COMMUNITY): Payer: Self-pay | Admitting: *Deleted

## 2014-03-20 ENCOUNTER — Encounter (HOSPITAL_COMMUNITY): Payer: Self-pay | Admitting: *Deleted

## 2019-06-14 ENCOUNTER — Emergency Department (HOSPITAL_BASED_OUTPATIENT_CLINIC_OR_DEPARTMENT_OTHER)
Admission: EM | Admit: 2019-06-14 | Discharge: 2019-06-14 | Disposition: A | Discharge status: Home / Self Care | Payer: Self-pay | Attending: Emergency Medicine | Admitting: Emergency Medicine

## 2019-06-14 ENCOUNTER — Encounter (HOSPITAL_BASED_OUTPATIENT_CLINIC_OR_DEPARTMENT_OTHER): Payer: Self-pay | Admitting: *Deleted

## 2019-06-14 ENCOUNTER — Other Ambulatory Visit: Payer: Self-pay

## 2019-06-14 DIAGNOSIS — Z87891 Personal history of nicotine dependence: Secondary | ICD-10-CM | POA: Insufficient documentation

## 2019-06-14 DIAGNOSIS — K0889 Other specified disorders of teeth and supporting structures: Secondary | ICD-10-CM | POA: Insufficient documentation

## 2019-06-14 MED ORDER — PENICILLIN V POTASSIUM 500 MG PO TABS: 500.0000 mg | ORAL_TABLET | Freq: Four times a day (QID) | ORAL | 0 refills | Status: AC

## 2019-06-14 MED ORDER — IBUPROFEN 800 MG PO TABS: 800.0000 mg | ORAL_TABLET | Freq: Three times a day (TID) | ORAL | 0 refills | Status: AC | PRN

## 2019-06-14 MED FILL — PENICILLIN VK 500 MG TABLET: 500 | 7 days supply | Qty: 28 | Fill #0

## 2019-06-14 NOTE — ED Provider Notes (Signed)
Emergency Department Provider Note   I have reviewed the triage vital signs and the nursing notes.   HISTORY  Chief Complaint Dental Pain   HPI Sydney Frazier is a 34 y.o. female presents to the ED with right sided dental pain starting this AM. She notes pain and swelling to the right face without vision change, fever, SOB, difficulty swallowing, HA. No dental trauma. Patient notes moderate to severe pain in the top molar on the right. She has had pain here in the past with h/o dental fracture. No regular dentist for f/u. No radiation of symptoms or modifying factors.   Past Medical History:  Diagnosis Date  . Medical history non-contributory     Patient Active Problem List   Diagnosis Date Noted  . Previous cesarean delivery affecting pregnancy 03/29/2012  . Active labor 03/29/2012    Past Surgical History:  Procedure Laterality Date  . ANKLE FRACTURE SURGERY    . CESAREAN SECTION    . CESAREAN SECTION WITH BILATERAL TUBAL LIGATION  03/29/2012   Per pt, was not allowed to have tubal  Surgeon: Brock Bad, MD;  Location: [facility];  Service: Obstetrics;  Laterality: Bilateral;  . WISDOM TOOTH EXTRACTION      Allergies Patient has no known allergies.  Family History  Problem Relation Age of Onset  . Cancer Father   . Hypertension Father     Social History Social History   Tobacco Use  . Smoking status: Former Games developer  . Smokeless tobacco: Never Used  Substance Use Topics  . Alcohol use: No  . Drug use: No    Review of Systems  Constitutional: No fever/chills Eyes: No visual changes. ENT: No sore throat. Positive dental pain.  Respiratory: Denies shortness of breath. Skin: Negative for rash. Neurological: Negative for headaches   ____________________________________________   PHYSICAL EXAM:  VITAL SIGNS: ED Triage Vitals  Enc Vitals Group     BP 06/14/19 1054 127/77     Pulse Rate 06/14/19 1054 71     Resp 06/14/19 1054 18   Temp 06/14/19 1054 98 F (36.7 C)     Temp Source 06/14/19 1054 Oral     SpO2 06/14/19 1054 100 %     Weight 06/14/19 1051 200 lb (90.7 kg)     Height 06/14/19 1051 5\' 7"  (1.702 m)   Constitutional: Alert and oriented. Well appearing and in no acute distress. Eyes: Conjunctivae are normal. PERRL. EOMI. Head: Atraumatic. Nose: No congestion/rhinnorhea. Mouth/Throat: Mucous membranes are moist.  Oropharynx non-erythematous. Missing top right molar without visible abscess. No trismus. Normal speech. Soft submandibular compartment. Managing oral secretions.  Neck: No stridor.    Respiratory: Normal respiratory effort.  Gastrointestinal: No distention.  Musculoskeletal: No gross deformities of extremities. Neurologic:  Normal speech and language.  Skin:  Skin is warm, dry and intact. No rash noted. No face cellulitis.   ____________________________________________   PROCEDURES  Procedure(s) performed:   Procedures  None ____________________________________________   INITIAL IMPRESSION / ASSESSMENT AND PLAN / ED COURSE  Pertinent labs & imaging results that were available during my care of the patient were reviewed by me and considered in my medical decision making (see chart for details).   Patient presents to the ED with right sided dental pain. No visible abscess. No clinical signs to suspect deep space head/neck infection. Will start Penicillin and NSAIDs. Patient given number for dental practice on call with instructions to call today for the next available appointment. Discussed ED return precautions  and f/u plan.    ____________________________________________  FINAL CLINICAL IMPRESSION(S) / ED DIAGNOSES  Final diagnoses:  Pain, dental    NEW OUTPATIENT MEDICATIONS STARTED DURING THIS VISIT:  New Prescriptions   IBUPROFEN (ADVIL) 800 MG TABLET    Take 1 tablet (800 mg total) by mouth every 8 (eight) hours as needed.   PENICILLIN V POTASSIUM (VEETID) 500 MG TABLET     Take 1 tablet (500 mg total) by mouth 4 (four) times daily for 7 days.    Note:  This document was prepared using Dragon voice recognition software and may include unintentional dictation errors.  Nanda Quinton, MD, [facility] Emergency Medicine    Darlinda Bellows, [facility], MD 06/15/19 1355

## 2019-06-14 NOTE — Discharge Instructions (Signed)
You were seen in the emergency department today with dental pain.  I am starting you on an antibiotic and will have you take Tylenol and/or Motrin as needed for pain.  Please call the dental practice listed.  They are on-call for [place] today and may be able to help you with getting your dental pain addressed.  You must call the office today to schedule the next available appointment.  Please return to the emergency department for any new or suddenly worsening symptoms.

## 2019-06-14 NOTE — ED Triage Notes (Signed)
Woke with swelling to the right side of her face. Dental pain.

## 2024-02-17 DEATH — deceased
